# Patient Record
Sex: Female | Born: 1935 | Hispanic: No | Marital: Married | Smoking: Never smoker
Health system: Southern US, Community
[De-identification: ages and names within clinical notes are randomized; demographics above are authoritative.]

## PROBLEM LIST (undated history)

## (undated) DIAGNOSIS — C801 Malignant (primary) neoplasm, unspecified: Secondary | ICD-10-CM

## (undated) HISTORY — PX: CHOLECYSTECTOMY: SHX55

---

## 2003-01-10 ENCOUNTER — Encounter: Payer: Self-pay | Admitting: Internal Medicine

## 2003-01-10 ENCOUNTER — Ambulatory Visit (HOSPITAL_COMMUNITY): Admission: RE | Admit: 2003-01-10 | Discharge: 2003-01-10 | Payer: Self-pay | Admitting: Internal Medicine

## 2003-02-22 ENCOUNTER — Ambulatory Visit (HOSPITAL_COMMUNITY): Admission: RE | Admit: 2003-02-22 | Discharge: 2003-02-22 | Payer: Self-pay | Admitting: Family Medicine

## 2003-03-03 ENCOUNTER — Ambulatory Visit (HOSPITAL_COMMUNITY): Admission: RE | Admit: 2003-03-03 | Discharge: 2003-03-03 | Payer: Self-pay | Admitting: Internal Medicine

## 2003-03-03 ENCOUNTER — Encounter: Payer: Self-pay | Admitting: Internal Medicine

## 2003-04-02 ENCOUNTER — Other Ambulatory Visit: Admission: RE | Admit: 2003-04-02 | Discharge: 2003-04-02 | Payer: Self-pay | Admitting: Family Medicine

## 2003-05-06 ENCOUNTER — Encounter: Payer: Self-pay | Admitting: Internal Medicine

## 2003-05-06 ENCOUNTER — Ambulatory Visit (HOSPITAL_COMMUNITY): Admission: RE | Admit: 2003-05-06 | Discharge: 2003-05-06 | Payer: Self-pay | Admitting: Internal Medicine

## 2003-06-04 ENCOUNTER — Ambulatory Visit (HOSPITAL_COMMUNITY): Admission: RE | Admit: 2003-06-04 | Discharge: 2003-06-04 | Payer: Self-pay | Admitting: Internal Medicine

## 2003-06-04 ENCOUNTER — Encounter: Payer: Self-pay | Admitting: Internal Medicine

## 2004-02-25 ENCOUNTER — Ambulatory Visit (HOSPITAL_COMMUNITY): Admission: RE | Admit: 2004-02-25 | Discharge: 2004-02-25 | Payer: Self-pay | Admitting: Family Medicine

## 2004-12-02 ENCOUNTER — Ambulatory Visit: Payer: Self-pay | Admitting: Nurse Practitioner

## 2004-12-17 ENCOUNTER — Ambulatory Visit: Payer: Self-pay | Admitting: Nurse Practitioner

## 2004-12-30 ENCOUNTER — Ambulatory Visit: Payer: Self-pay | Admitting: Nurse Practitioner

## 2005-03-04 ENCOUNTER — Ambulatory Visit (HOSPITAL_COMMUNITY): Admission: RE | Admit: 2005-03-04 | Discharge: 2005-03-04 | Payer: Self-pay | Admitting: Family Medicine

## 2005-03-25 ENCOUNTER — Ambulatory Visit: Payer: Self-pay | Admitting: Nurse Practitioner

## 2005-03-26 ENCOUNTER — Ambulatory Visit (HOSPITAL_COMMUNITY): Admission: RE | Admit: 2005-03-26 | Discharge: 2005-03-26 | Payer: Self-pay | Admitting: Internal Medicine

## 2005-04-26 ENCOUNTER — Ambulatory Visit: Payer: Self-pay | Admitting: Nurse Practitioner

## 2005-12-22 ENCOUNTER — Emergency Department (HOSPITAL_COMMUNITY): Admission: EM | Admit: 2005-12-22 | Discharge: 2005-12-22 | Payer: Self-pay | Admitting: *Deleted

## 2006-01-12 ENCOUNTER — Ambulatory Visit: Payer: Self-pay | Admitting: Nurse Practitioner

## 2006-03-09 ENCOUNTER — Ambulatory Visit (HOSPITAL_COMMUNITY): Admission: RE | Admit: 2006-03-09 | Discharge: 2006-03-09 | Payer: Self-pay | Admitting: Internal Medicine

## 2006-08-31 ENCOUNTER — Inpatient Hospital Stay (HOSPITAL_COMMUNITY): Admission: EM | Admit: 2006-08-31 | Discharge: 2006-09-07 | Payer: Self-pay | Admitting: Emergency Medicine

## 2006-09-01 ENCOUNTER — Encounter (INDEPENDENT_AMBULATORY_CARE_PROVIDER_SITE_OTHER): Payer: Self-pay | Admitting: Specialist

## 2006-12-01 ENCOUNTER — Ambulatory Visit (HOSPITAL_COMMUNITY): Admission: RE | Admit: 2006-12-01 | Discharge: 2006-12-01 | Payer: Self-pay | Admitting: Gastroenterology

## 2007-03-15 ENCOUNTER — Ambulatory Visit (HOSPITAL_COMMUNITY): Admission: RE | Admit: 2007-03-15 | Discharge: 2007-03-15 | Payer: Self-pay | Admitting: Nurse Practitioner

## 2007-10-18 ENCOUNTER — Encounter (INDEPENDENT_AMBULATORY_CARE_PROVIDER_SITE_OTHER): Payer: Self-pay | Admitting: Nurse Practitioner

## 2007-10-18 ENCOUNTER — Ambulatory Visit: Payer: Self-pay | Admitting: Family Medicine

## 2007-10-18 LAB — CONVERTED CEMR LAB
ALT: 12 units/L (ref 0–35)
BUN: 12 mg/dL (ref 6–23)
Basophils Absolute: 0 10*3/uL (ref 0.0–0.1)
CO2: 24 meq/L (ref 19–32)
Cholesterol: 200 mg/dL (ref 0–200)
Creatinine, Ser: 0.71 mg/dL (ref 0.40–1.20)
Eosinophils Relative: 2 % (ref 0–5)
Glucose, Bld: 80 mg/dL (ref 70–99)
HCT: 42.6 % (ref 36.0–46.0)
HDL: 52 mg/dL (ref >39)
Hemoglobin: 13.8 g/dL (ref 12.0–15.0)
Lymphocytes Relative: 23 % (ref 12–46)
Monocytes Absolute: 0.3 10*3/uL (ref 0.1–1.0)
RDW: 12.9 % (ref 11.5–15.5)
Total Bilirubin: 0.6 mg/dL (ref 0.3–1.2)
Total CHOL/HDL Ratio: 3.8
Triglycerides: 109 mg/dL (ref <150)
VLDL: 22 mg/dL (ref 0–40)

## 2008-02-27 ENCOUNTER — Ambulatory Visit: Payer: Self-pay | Admitting: Internal Medicine

## 2008-03-20 ENCOUNTER — Ambulatory Visit (HOSPITAL_COMMUNITY): Admission: RE | Admit: 2008-03-20 | Discharge: 2008-03-20 | Payer: Self-pay | Admitting: Family Medicine

## 2008-10-15 ENCOUNTER — Ambulatory Visit: Payer: Self-pay | Admitting: Family Medicine

## 2008-10-15 ENCOUNTER — Encounter (INDEPENDENT_AMBULATORY_CARE_PROVIDER_SITE_OTHER): Payer: Self-pay | Admitting: Internal Medicine

## 2008-10-15 LAB — CONVERTED CEMR LAB
Alkaline Phosphatase: 77 units/L (ref 39–117)
BUN: 11 mg/dL (ref 6–23)
Cholesterol: 190 mg/dL (ref 0–200)
Creatinine, Ser: 0.75 mg/dL (ref 0.40–1.20)
Glucose, Bld: 95 mg/dL (ref 70–99)
HDL: 45 mg/dL (ref >39)
LDL Cholesterol: 121 mg/dL — ABNORMAL HIGH (ref 0–99)
Total Bilirubin: 0.7 mg/dL (ref 0.3–1.2)
Triglycerides: 119 mg/dL (ref <150)
VLDL: 24 mg/dL (ref 0–40)

## 2008-10-29 ENCOUNTER — Ambulatory Visit: Payer: Self-pay | Admitting: Internal Medicine

## 2008-11-20 ENCOUNTER — Ambulatory Visit: Payer: Self-pay | Admitting: Internal Medicine

## 2009-02-05 ENCOUNTER — Ambulatory Visit: Payer: Self-pay | Admitting: Internal Medicine

## 2009-04-21 ENCOUNTER — Ambulatory Visit (HOSPITAL_COMMUNITY): Admission: RE | Admit: 2009-04-21 | Discharge: 2009-04-21 | Payer: Self-pay | Admitting: *Deleted

## 2009-06-04 ENCOUNTER — Encounter (INDEPENDENT_AMBULATORY_CARE_PROVIDER_SITE_OTHER): Payer: Self-pay | Admitting: Internal Medicine

## 2009-06-04 ENCOUNTER — Ambulatory Visit: Payer: Self-pay | Admitting: Internal Medicine

## 2009-06-18 ENCOUNTER — Ambulatory Visit: Payer: Self-pay | Admitting: Internal Medicine

## 2009-09-15 ENCOUNTER — Ambulatory Visit: Payer: Self-pay | Admitting: Internal Medicine

## 2009-10-23 ENCOUNTER — Emergency Department (HOSPITAL_COMMUNITY): Admission: EM | Admit: 2009-10-23 | Discharge: 2009-10-23 | Payer: Self-pay | Admitting: Emergency Medicine

## 2009-11-11 ENCOUNTER — Ambulatory Visit (HOSPITAL_COMMUNITY): Admission: RE | Admit: 2009-11-11 | Discharge: 2009-11-11 | Payer: Self-pay | Admitting: Plastic Surgery

## 2009-12-09 ENCOUNTER — Ambulatory Visit (HOSPITAL_COMMUNITY): Admission: RE | Admit: 2009-12-09 | Discharge: 2009-12-09 | Payer: Self-pay | Admitting: Plastic Surgery

## 2009-12-15 ENCOUNTER — Ambulatory Visit: Payer: Self-pay | Admitting: Internal Medicine

## 2010-04-24 ENCOUNTER — Ambulatory Visit (HOSPITAL_COMMUNITY): Admission: RE | Admit: 2010-04-24 | Discharge: 2010-04-24 | Payer: Self-pay | Admitting: Family Medicine

## 2011-03-26 NOTE — Consult Note (Signed)
NAMEHELENE, Christina [name] NO.:  [id number]   MEDICAL RECORD NO.:  [id number]          PATIENT TYPE:  INP   LOCATION:  5014                         FACILITY:  [facility]   PHYSICIAN:  Graylin Shiver, M.D.   DATE OF BIRTH:  1931-10-04   DATE OF CONSULTATION:  09/04/2006  DATE OF DISCHARGE:                                   CONSULTATION   REASON FOR CONSULTATION:  The patient is a 77 year old Caucasian female  status post laparoscopic cholecystectomy on September 01, 2006 for  cholecystitis and gallstones.  Her intraoperative cholangiogram was  negative.  No common duct stones were seen.  No leaking was seen.  Three  clips were placed on the cystic duct.  A drain was left in place because of  a raw surface around the gallbladder bed per operative report.  The patient  reports continued drainage in her drain and the bulb having to be changed  more often.  She is still experiencing some pain and the right upper  quadrant.   ALLERGIES:  CYANOCOBALAMIN.   MEDICATIONS PRIOR TO ADMISSION:  None.   PAST MEDICAL HISTORY:  None stated.   PAST SURGICAL HISTORY:  1. Appendectomy.  2. Hemorrhoidectomy.  3. Cataract surgery.  4. Cholecystectomy.   SOCIAL HISTORY:  Does not smoke or drink alcohol.   REVIEW OF SYSTEMS:  No chest pain, shortness of breath, cough or sputum  production.   PHYSICAL EXAMINATION:  VITAL SIGNS:  Stable.  She is afebrile.  HEENT:  Nonicteric.  HEART:  Regular rhythm.  No murmurs.  LUNGS:  Clear.  ABDOMEN:  Bowel sounds normal, soft.  Minimal tenderness in the right upper  quadrant compatible with postoperative state.   LABORATORY DATA:  WBC 6200, bilirubin 1.4, alkaline phosphatase 118, AST 35,  ALT 87.   IMPRESSION:  1. Status post laparoscopic cholecystectomy secondary to cholecystitis and      cholelithiasis.  2. Question of a bile leak versus continued drainage from raw surface of      the gallbladder bed.   PLAN:  Initially, we will proceed  with a HIDA scan to see if there is any  evidence of a bile leak.  If there is evidence of a bile leak, then we will  set her up for ERCP with stent placement.           ______________________________  Graylin Shiver, M.D.     SFG/MEDQ  D:  09/04/2006  T:  09/05/2006  Job:  [id number]   cc:   Ollen Gross. Vernell Morgans, M.D.

## 2011-03-26 NOTE — Discharge Summary (Signed)
[name], [name]                ACCOUNT NO.:  [id number]   MEDICAL RECORD NO.:  [id number]          PATIENT TYPE:  INP   LOCATION:  5014                         FACILITY:  [facility]   PHYSICIAN:  Maisie Fus A. Cornett, M.D.DATE OF BIRTH:  1931/08/21   DATE OF ADMISSION:  08/31/2006  DATE OF DISCHARGE:  09/07/2006                                 DISCHARGE SUMMARY   ADMISSION DIAGNOSES:  1. Acute cholecystitis.  2. Bile leak status post laparoscopic cholecystectomy.   DISCHARGE DIAGNOSES:  Same.   PROCEDURE PERFORMED:  1. Laparoscopic cholecystectomy, cholangiogram by Dr. Luretha Murphy.  2. ERCP, Dr. Vida Rigger.   BRIEF HISTORY:  The patient is a 77 year old female who came in with  cholecystitis on August 31, 2006.  She was admitted for laparoscopic  cholecystectomy, which was done on September 01, 2006.  Please see operative  note for details.   The patient was admitted post-op on September 01, 2006 after a laparoscopic  cholecystectomy.  She had a drain in place which showed bilious drainage,  which increased in quantity.  Her LFTs were relatively stable, but she  continued to have some discomfort in her upper abdomen and did not feel  well.  Gastroenterology was consulted on September 03, 2006 because of the  increasing bilious drainage and increased abdominal pain.  Hepatic biliary  scan was obtained on September 05, 2006, which showed extravasation of bile,  and ERCP with stent was performed.  After that, her bile drainage decreased  considerably.  Her pain resolved, and her fever went away.  Her white count  was normal.  She tolerating a diet, had a benign abdominal examination and  had some minimal serosanguineous bilious tinted fluid at discharge.  It was  felt she was safe for discharge this point.  She was on Cipro will complete  a 7-day course of that as an outpatient.   DISCHARGE INSTRUCTIONS:  She will follow up next week with Dr. Luretha Murphy in the clinic to have her  drain removed.  The office will call to  contact her for that appointment.  We will show her how to take care of her  drain and empty it every day.  She will continue Cipro 500 mg p.o. b.i.d.  and Vicodin 1-2 tabs p.o. q.4 p.r.n. pain.  She will shower and refrain from  driving for 1 week and lifting for 2 weeks.  Her diet will be a regular diet  as tolerated.   CONDITION ON DISCHARGE:  Improved.      Thomas A. Cornett, M.D.  Electronically Signed    TAC/MEDQ  D:  09/07/2006  T:  09/07/2006  Job:  [id number]

## 2011-03-26 NOTE — Op Note (Signed)
NAMEMARGENE, CHERIAN                ACCOUNT NO.:  [id number]   MEDICAL RECORD NO.:  [id number]          PATIENT TYPE:  AMB   LOCATION:  ENDO                         FACILITY:  [facility]   PHYSICIAN:  Petra Kuba, M.D.    DATE OF BIRTH:  12/15/1930   DATE OF PROCEDURE:  12/01/2006  DATE OF DISCHARGE:                               OPERATIVE REPORT   PROCEDURE:  EGD with stent removal.   INDICATIONS:  Patient with stent placed due to bile leak, want to remove  since doing well postoperatively.  Consent was signed after risks,  benefits, methods and options thoroughly discussed in the office with  both the patient and her family.   MEDICINES USED:  Fentanyl 75 mcg, Versed 6 mg.   PROCEDURE:  The video endoscope was inserted by direct vision.  Esophagus was normal.  Quick look at the stomach on straight  visualization did not reveal any obvious problems.  We did advance  through a normal antrum, normal pylorus, into a normal duodenal bulb and  around the C-loop where the stent was found in the proper position and  was easily snared and withdrawn.  Both were removed in tandem.  The  procedure was terminated at this junction.  Once the stent was  recovered, the patient tolerated the procedure well.  There was no  obvious immediate complication.   ENDOSCOPIC DIAGNOSIS:  Normal quick esophagogastroduodenoscopy, status  post snare and removal of the biliary stent.   PLAN:  Happy to see back p.r.n.  See how she does.  Otherwise, return  care to Dr. Emeline Darling for the customary healthcare screening and maintenance.           ______________________________  Petra Kuba, M.D.     MEM/MEDQ  D:  12/01/2006  T:  12/01/2006  Job:  [id number]   cc:   Thornton Park Daphine Deutscher, MD  Duke Salvia, M.D.

## 2011-03-26 NOTE — Op Note (Signed)
Christina [name], Christina [name]                ACCOUNT NO.:  [id number]   MEDICAL RECORD NO.:  [id number]          PATIENT TYPE:  INP   LOCATION:  5014                         FACILITY:  [facility]   PHYSICIAN:  Petra Kuba, M.D.    DATE OF BIRTH:  [birth year]   DATE OF PROCEDURE:  09/05/2006  DATE OF DISCHARGE:                                 OPERATIVE REPORT   PROCEDURE:  Endoscopic retrograde cholangiopancreatography sphincterotomy  and stent placement.   INDICATIONS:  Bile leak.  Consent was signed after risks, benefits, methods,  options thoroughly discussed by Dr. Randa Evens prior to any sedation and me  with the patient prior to any sedation.   MEDICINES USED:  Fentanyl 125 mcg, Versed 12.5 mg.   PROCEDURE:  The side-viewing therapeutic video duodenoscope was inserted by  indirect vision into the stomach and advanced through a normal pylorus into  the duodenum and a fairly normal slightly bulbous ampulla was brought into  view.  We did start the procedure with her on her left side.  We went ahead  and tried to cannulate with the triple-lumen sphincterotome loaded with the  Jag wire.  Unfortunately in advancing the wire we seemed to go towards the  pancreas.  We did try to inject dye a few times but it back washed into the  duodenum so we elected after a few attempts to roll her on her belly and  then we were able to get deep selective cannulation again cannulating with  the wire in the triple-lumen sphincterotome.  The wire did go a few times  towards the pancreas but in reconfiguring the sphincterotome we were able to  get deep selective cannulation.  Once that was done, the CBD was filled  which was normal size, normal intrahepatic, cystic duct remnant was normal  although off the intrahepatics seemingly instead of the main CBD but without  obvious leak.  We went ahead and proceeded with a small to medium-sized  sphincterotomy until adequate biliary drainage was seen and we were able to  get the half bowed sphincterotome in and out of the duct.  We then exchanged  the sphincterotome with a 10-French 7 cm stent since no stones were seen on  intraoperative cholangiogram and on cholangiogram today.  The 10-French 7 cm  stent was placed in the usual fashion.  The introducer and wire were  removed.  There was adequate biliary drainage, we elected to stop the  procedure at this junction.  The scope was removed.  The patient tolerated  the procedure well.  There was no obvious immediate complication.   ENDOSCOPIC DIAGNOSES:  1. Bulbous ampulla.  2. No pancreatic duct injections, although the wire with seemingly      advanced in that direction a few times.  3. Normal common bile duct and bifurcation cystic duct remnant without      obvious leak. Though the cystic duct seemed to be coming off with the      intrahepatics.  4. Medium sphincterotomy done.  5. 10-French 7 cm stent placed with adequate biliary drainage.   PLAN:  Will observe for delayed complications.  See how the stent works,  assuming no delayed complications and the patient improved and slowly  advance diet and hopefully be able to go home and follow-up with me in the  office in 4 to 6 weeks to set up presumed stent removal.  Happy to see back  sooner p.r.n.           ______________________________  Petra Kuba, M.D.     MEM/MEDQ  D:  09/05/2006  T:  09/06/2006  Job:  [id number]   cc:   Thornton Park Daphine Deutscher, MD

## 2011-03-26 NOTE — Op Note (Signed)
Christina [name], Christina [name]                ACCOUNT NO.:  [id number]   MEDICAL RECORD NO.:  [id number]          PATIENT TYPE:  INP   LOCATION:  5014                         FACILITY:  [facility]   PHYSICIAN:  Thornton Park. Daphine Deutscher, MD  DATE OF BIRTH:  1931/10/23   DATE OF PROCEDURE:  09/01/2006  DATE OF DISCHARGE:                                 OPERATIVE REPORT   PREOPERATIVE DIAGNOSIS:  Cholecystitis.   POSTOPERATIVE DIAGNOSIS:  Acute and subacute cholecystitis with normal  intraoperative cholangiogram.   PROCEDURE:  Laparoscopic cholecystectomy with intraoperative cholangiogram.   DRAIN:  One 19 Blake drain.   SURGEON:  Thornton Park. Daphine Deutscher, MD.   ASSISTANT:  Velora Heckler, MD.   ANESTHESIA:  General.   DESCRIPTION OF PROCEDURE:  Ms. Douse was taken to OR #17 and given general  anesthesia.  The abdomen was prepped with Betadine and draped sterilely.  Access was gained through the umbilicus with a longitudinal incision and a  Hasson cannula.  Three trocars were placed in the upper abdomen.  Her  gallbladder was completely walled off and appeared to be chronically  inflamed.  She also had a lot of adhesions above her liver possibly from her  previous appendectomy back in 1949.  She could have had a ruptured appendix  with an abscess.  We went ahead and stripped this away and did get into some  bleeding, I had to put some clips up on the fat.  Once again, I freed this  area up.  I then decompressed gallbladder and got out a lot of bowel and  then elevated the gallbladder and dissected out the cystic duct.  I put a  Reddick catheter in after clipping the proximal cystic duct and the arteries  and did dynamic cholangiogram which showed free filling of the common bile  duct with the intrahepatic ducts filled and free flow into the duodenum.  The cystic duct was then triple clipped and divided.  Cystic artery was  divided after clipping and the gallbladder was removed from the gallbladder  bed.  It  was placed in a bag and brought out through the umbilicus.  It  contained a lot of black anthracotic pigment stones.  The gallbladder bed  was then cauterized.  No bleeding or bile leaks were definitely seen it was  just a very raw surface so I went ahead and put a drain in.   The umbilical defect was repaired under laparoscopic vision with 0 Vicryl  and then I closed the skin with 4-0 Vicryl.  Benzoin and Steri-Strips  applied.  The patient tolerated the procedure well and was taken to the  recovery room in satisfactory condition.      Thornton Park Daphine Deutscher, MD  Electronically Signed    MBM/MEDQ  D:  09/01/2006  T:  09/02/2006  Job:  [id number]

## 2011-04-22 ENCOUNTER — Other Ambulatory Visit (HOSPITAL_COMMUNITY): Payer: Self-pay | Admitting: Family Medicine

## 2011-04-22 DIAGNOSIS — Z1231 Encounter for screening mammogram for malignant neoplasm of breast: Secondary | ICD-10-CM

## 2011-05-05 ENCOUNTER — Ambulatory Visit (HOSPITAL_COMMUNITY)
Admission: RE | Admit: 2011-05-05 | Discharge: 2011-05-05 | Disposition: A | Discharge status: Home / Self Care | Payer: Medicare Other | Source: Ambulatory Visit | Attending: Family Medicine | Admitting: Family Medicine

## 2011-05-05 DIAGNOSIS — Z1231 Encounter for screening mammogram for malignant neoplasm of breast: Secondary | ICD-10-CM | POA: Insufficient documentation

## 2017-03-17 ENCOUNTER — Ambulatory Visit (INDEPENDENT_AMBULATORY_CARE_PROVIDER_SITE_OTHER): Payer: Medicare Other

## 2017-03-17 ENCOUNTER — Ambulatory Visit (INDEPENDENT_AMBULATORY_CARE_PROVIDER_SITE_OTHER): Payer: Medicare Other | Admitting: Orthopaedic Surgery

## 2017-03-17 DIAGNOSIS — M25551 Pain in right hip: Secondary | ICD-10-CM | POA: Diagnosis not present

## 2017-03-17 DIAGNOSIS — M25562 Pain in left knee: Secondary | ICD-10-CM

## 2017-03-17 DIAGNOSIS — M7061 Trochanteric bursitis, right hip: Secondary | ICD-10-CM | POA: Diagnosis not present

## 2017-03-17 DIAGNOSIS — G8929 Other chronic pain: Secondary | ICD-10-CM

## 2017-03-17 DIAGNOSIS — M25561 Pain in right knee: Secondary | ICD-10-CM | POA: Diagnosis not present

## 2017-03-17 MED ORDER — LIDOCAINE HCL 1 % IJ SOLN
3.0000 mL | INTRAMUSCULAR | Status: AC | PRN
  Administered 2017-03-17: 3 mL

## 2017-03-17 MED ORDER — METHYLPREDNISOLONE ACETATE 40 MG/ML IJ SUSP
40.0000 mg | INTRAMUSCULAR | Status: AC | PRN
  Administered 2017-03-17: 40 mg via INTRA_ARTICULAR

## 2017-03-17 NOTE — Progress Notes (Signed)
ZA

## 2017-03-17 NOTE — Progress Notes (Signed)
Office Visit Note   Patient: Christina [name]           Date of Birth: August 02, 1931           MRN: [id number] Visit Date: 03/17/2017              Requested by: Nolene Ebbs, MD [address] [place], [place] [address] PCP: Nolene Ebbs, MD   Assessment & Plan: Visit Diagnoses:  1. Chronic pain of left knee   2. Chronic pain of right knee   3. Pain in right hip   4. Trochanteric bursitis, right hip     Plan: She would definitely benefit from quad strengthening exercises and I showed her these and have them demonstrated back to me. Also should benefit from trochanteric stretching exercises as well and she was able to demonstrate these back to me. Certainly all 3 areas could benefit from steroid injections. She tolerated these well after expanded the risk and benefits of injections. The other thing I could recommend for her would be Aleve or Motrin 2-3 times a day as needed. Also physical therapy for conditioning and strengthening if needed. Her daughter said if she needs that she will let us know. She'll otherwise follow up as needed. All questions were encouraged and answered.  Follow-Up Instructions: Return if symptoms worsen or fail to improve.   Orders:  Orders Placed This Encounter  Procedures  . Large Joint Injection/Arthrocentesis  . Large Joint Injection/Arthrocentesis  . Large Joint Injection/Arthrocentesis  . XR HIP UNILAT W OR W/O PELVIS 1V RIGHT  . XR Knee 1-2 Views Left  . XR Knee 1-2 Views Right   No orders of the defined types were placed in this encounter.     Procedures: Large Joint Inj Date/Time: 03/17/2017 5:05 PM Performed by: Mcarthur Rossetti Authorized by: Jean Rosenthal Y   Location:  Knee Site:  R knee Ultrasound Guidance: No   Fluoroscopic Guidance: No   Arthrogram: No   Medications:  3 mL lidocaine 1 %; 40 mg methylPREDNISolone acetate 40 MG/ML Large Joint Inj Date/Time: 03/17/2017 5:05 PM Performed by: Mcarthur Rossetti Authorized by: Mcarthur Rossetti   Location:  Knee Site:  L knee Ultrasound Guidance: No   Fluoroscopic Guidance: No   Arthrogram: No   Medications:  3 mL lidocaine 1 %; 40 mg methylPREDNISolone acetate 40 MG/ML Large Joint Inj Date/Time: 03/17/2017 5:05 PM Performed by: Mcarthur Rossetti Authorized by: Mcarthur Rossetti   Location:  Hip Site:  R greater trochanter Ultrasound Guidance: No   Fluoroscopic Guidance: No   Arthrogram: No   Medications:  3 mL lidocaine 1 %; 40 mg methylPREDNISolone acetate 40 MG/ML     Clinical Data: No additional findings.   Subjective: No chief complaint on file. The patient is a very pleasant 83 year old female originally from [place] who comes with her daughter with chief complaint of bilateral knee pain and right hip pain. Slowly worsening for years. She is someone who walks with a cane in her right hand. She does not do a lot of exercising otherwise though she is a healthy individual who doesn't really take any significant medications either. She denies locking catching in either knee. She does have slight groin pain on the right hip. Her biggest problems going up and downstairs. She denies any injuries. It has detrimentally affected her activities daily living, her quality of life and her mobility.  HPI  Review of Systems She currently denies any headache, chest pain, short of  breath, fever, chills, nausea, vomiting.  Objective: Vital Signs: There were no vitals taken for this visit.  Physical Exam She is alert and oriented 3 mobilizes slowly. She is in no acute distress. She follows commands appropriately. Ortho Exam Examination of her right hip shows fluid range of motion with no pain in the groin on my exam today. Her pain is only over the trochanteric area and IT band. Examination of both knee show slight valgus malalignment and some patellofemoral crepitation but no effusion. Both knees are ligamentously stable.  Both knees have good range of motion. Specialty Comments:  No specialty comments available.  Imaging: Xr Hip Unilat W Or W/o Pelvis 1v Right  Result Date: 03/17/2017 An AP pelvis lateral of her right hip show well maintained hip joint space. There are no acute changes in either hip or trochanteric areas. There is no significant arthritic changes.  Xr Knee 1-2 Views Left  Result Date: 03/17/2017 An AP and lateral of the left knee show no acute findings. There is only mild arthritic changes.  Xr Knee 1-2 Views Right  Result Date: 03/17/2017 An AP and lateral of the right knee shows no acute findings. There is only mild arthritic changes.    PMFS History: Patient Active Problem List   Diagnosis Date Noted  . Chronic pain of left knee 03/17/2017  . Chronic pain of right knee 03/17/2017   No past medical history on file.  No family history on file.  No past surgical history on file. Social History   Occupational History  . Not on file.   Social History Main Topics  . Smoking status: Not on file  . Smokeless tobacco: Not on file  . Alcohol use Not on file  . Drug use: Unknown  . Sexual activity: Not on file

## 2018-03-09 ENCOUNTER — Encounter (INDEPENDENT_AMBULATORY_CARE_PROVIDER_SITE_OTHER): Payer: Self-pay | Admitting: Orthopaedic Surgery

## 2018-03-09 ENCOUNTER — Ambulatory Visit (INDEPENDENT_AMBULATORY_CARE_PROVIDER_SITE_OTHER): Payer: Medicare Other | Admitting: Orthopaedic Surgery

## 2018-03-09 DIAGNOSIS — M25562 Pain in left knee: Secondary | ICD-10-CM | POA: Diagnosis not present

## 2018-03-09 DIAGNOSIS — M25561 Pain in right knee: Secondary | ICD-10-CM

## 2018-03-09 DIAGNOSIS — G8929 Other chronic pain: Secondary | ICD-10-CM | POA: Diagnosis not present

## 2018-03-09 MED ORDER — LIDOCAINE HCL 1 % IJ SOLN
3.0000 mL | INTRAMUSCULAR | Status: AC | PRN
  Administered 2018-03-09: 3 mL

## 2018-03-09 MED ORDER — METHYLPREDNISOLONE ACETATE 40 MG/ML IJ SUSP
40.0000 mg | INTRAMUSCULAR | Status: AC | PRN
  Administered 2018-03-09: 40 mg via INTRA_ARTICULAR

## 2018-03-09 NOTE — Progress Notes (Signed)
Office Visit Note   Patient: Christina [name]           Date of Birth: 1930/12/25           MRN: [id number] Visit Date: 03/09/2018              Requested by: Nolene Ebbs, MD [address] [place], [place] [address] PCP: Nolene Ebbs, MD   Assessment & Plan: Visit Diagnoses:  1. Chronic pain of left knee   2. Chronic pain of right knee     Plan: I agree with trying steroid injections in both her knees given her age and health status.  She tolerated them well.  She understands the risk and benefits of these injections as well.  Follow-up will be as needed.  The family knows to wait at least 3 to 4 months between steroid injections.  All questions concerns were answered and addressed.  Follow-Up Instructions: Return if symptoms worsen or fail to improve.   Orders:  Orders Placed This Encounter  Procedures  . Large Joint Inj  . Large Joint Inj   No orders of the defined types were placed in this encounter.     Procedures: Large Joint Inj: R knee on 03/09/2018 3:06 PM Indications: diagnostic evaluation and pain Details: 22 G 1.5 in needle, superolateral approach  Arthrogram: No  Medications: 3 mL lidocaine 1 %; 40 mg methylPREDNISolone acetate 40 MG/ML Outcome: tolerated well, no immediate complications Procedure, treatment alternatives, risks and benefits explained, specific risks discussed. Consent was given by the patient. Immediately prior to procedure a time out was called to verify the correct patient, procedure, equipment, support staff and site/side marked as required. Patient was prepped and draped in the usual sterile fashion.   Large Joint Inj: L knee on 03/09/2018 3:07 PM Indications: diagnostic evaluation and pain Details: 22 G 1.5 in needle, superolateral approach  Arthrogram: No  Medications: 3 mL lidocaine 1 %; 40 mg methylPREDNISolone acetate 40 MG/ML Outcome: tolerated well, no immediate complications Procedure, treatment alternatives, risks and benefits  explained, specific risks discussed. Consent was given by the patient. Immediately prior to procedure a time out was called to verify the correct patient, procedure, equipment, support staff and site/side marked as required. Patient was prepped and draped in the usual sterile fashion.       Clinical Data: No additional findings.   Subjective: Chief Complaint  Patient presents with  . Left Knee - Pain  . Right Knee - Pain  The patient comes in today for injections in both her knees.  She ambulates with a cane.  She is 84 years old and originally from [place].  She does deal with bad arthritis pain in both her knees and steroids have worked for her in the past.  She would like to have these in both knees today.  She is not a diabetic.  Her son is with her to interpret.  HPI  Review of Systems She currently denies any headache, chest pain, shortness of breath, fever, chills, nausea, vomiting.  Objective: Vital Signs: There were no vitals taken for this visit.  Physical Exam She is alert and oriented x3 and in no acute distress  Ortho Exam examination of both knees show pain throughout her arc of motion in both knees.  Both lower extremities have edema.  The knees are otherwise ligamentously stable. Specialty Comments:  No specialty comments available.  Imaging: No results found.   PMFS History: Patient Active Problem List   Diagnosis Date Noted  .  Chronic pain of left knee 03/17/2017  . Chronic pain of right knee 03/17/2017   History reviewed. No pertinent past medical history.  History reviewed. No pertinent family history.  History reviewed. No pertinent surgical history. Social History   Occupational History  . Not on file  Tobacco Use  . Smoking status: Not on file  Substance and Sexual Activity  . Alcohol use: Not on file  . Drug use: Not on file  . Sexual activity: Not on file

## 2018-05-31 ENCOUNTER — Telehealth (INDEPENDENT_AMBULATORY_CARE_PROVIDER_SITE_OTHER): Payer: Self-pay | Admitting: Orthopaedic Surgery

## 2018-05-31 NOTE — Telephone Encounter (Signed)
Cleone Slim from [facility] called left voicemail message needing office notes faxed to her for DOS 03/09/18. The fax# is [phone]   Cleone Slim did not leave a phone number.

## 2018-06-01 NOTE — Telephone Encounter (Signed)
Faxed to provided number  

## 2018-07-26 ENCOUNTER — Ambulatory Visit (INDEPENDENT_AMBULATORY_CARE_PROVIDER_SITE_OTHER): Payer: Medicare Other | Admitting: Orthopaedic Surgery

## 2018-07-26 ENCOUNTER — Ambulatory Visit (INDEPENDENT_AMBULATORY_CARE_PROVIDER_SITE_OTHER): Payer: Medicare Other

## 2018-07-26 ENCOUNTER — Encounter (INDEPENDENT_AMBULATORY_CARE_PROVIDER_SITE_OTHER): Payer: Self-pay | Admitting: Orthopaedic Surgery

## 2018-07-26 DIAGNOSIS — M25561 Pain in right knee: Secondary | ICD-10-CM | POA: Diagnosis not present

## 2018-07-26 DIAGNOSIS — M25562 Pain in left knee: Secondary | ICD-10-CM | POA: Diagnosis not present

## 2018-07-26 DIAGNOSIS — M25511 Pain in right shoulder: Secondary | ICD-10-CM

## 2018-07-26 DIAGNOSIS — M1712 Unilateral primary osteoarthritis, left knee: Secondary | ICD-10-CM | POA: Diagnosis not present

## 2018-07-26 DIAGNOSIS — M1711 Unilateral primary osteoarthritis, right knee: Secondary | ICD-10-CM | POA: Diagnosis not present

## 2018-07-26 MED ORDER — LIDOCAINE HCL 1 % IJ SOLN
1.0000 mL | INTRAMUSCULAR | Status: AC | PRN
  Administered 2018-07-26: 1 mL

## 2018-07-26 MED ORDER — METHYLPREDNISOLONE ACETATE 40 MG/ML IJ SUSP
40.0000 mg | INTRAMUSCULAR | Status: AC | PRN
  Administered 2018-07-26: 40 mg via INTRA_ARTICULAR

## 2018-07-26 MED ORDER — LIDOCAINE HCL 1 % IJ SOLN
3.0000 mL | INTRAMUSCULAR | Status: AC | PRN
  Administered 2018-07-26: 3 mL

## 2018-07-26 NOTE — Progress Notes (Signed)
Office Visit Note   Patient: Christina [name]           Date of Birth: [birth year]           MRN: [id number] Visit Date: 07/26/2018              Requested by: Nolene Ebbs, MD [address] [place], [place] [address] PCP: Nolene Ebbs, MD   Assessment & Plan: Visit Diagnoses:  1. Right shoulder pain, unspecified chronicity   2. Left knee pain, unspecified chronicity   3. Right knee pain, unspecified chronicity   4. Unilateral primary osteoarthritis, right knee   5. Unilateral primary osteoarthritis, left knee     Plan: Since she is not a diabetic I did not mind providing steroid injections per her request in both knees and her right shoulder.  This is the best treatment options for someone of her medical status.  Questions were answered and addressed.  She tolerated all 3 injections well.  Follow-up will be as needed.  Follow-Up Instructions: Return if symptoms worsen or fail to improve.   Orders:  Orders Placed This Encounter  Procedures  . Large Joint Inj  . Large Joint Inj  . Large Joint Inj  . XR Knee 1-2 Views Left  . XR Knee 1-2 Views Right  . XR Shoulder Right   No orders of the defined types were placed in this encounter.     Procedures: Large Joint Inj: R subacromial bursa on 07/26/2018 3:37 PM Indications: pain and diagnostic evaluation Details: 22 G 1.5 in needle  Arthrogram: No  Medications: 3 mL lidocaine 1 %; 40 mg methylPREDNISolone acetate 40 MG/ML Outcome: tolerated well, no immediate complications Procedure, treatment alternatives, risks and benefits explained, specific risks discussed. Consent was given by the patient. Immediately prior to procedure a time out was called to verify the correct patient, procedure, equipment, support staff and site/side marked as required. Patient was prepped and draped in the usual sterile fashion.   Large Joint Inj: R knee on 07/26/2018 3:37 PM Indications: diagnostic evaluation and pain Details: 22 G 1.5 in  needle, superolateral approach  Arthrogram: No  Medications: 3 mL lidocaine 1 %; 40 mg methylPREDNISolone acetate 40 MG/ML Outcome: tolerated well, no immediate complications Procedure, treatment alternatives, risks and benefits explained, specific risks discussed. Consent was given by the patient. Immediately prior to procedure a time out was called to verify the correct patient, procedure, equipment, support staff and site/side marked as required. Patient was prepped and draped in the usual sterile fashion.   Large Joint Inj: L knee on 07/26/2018 3:37 PM Indications: diagnostic evaluation and pain Details: 22 G 1.5 in needle, superolateral approach  Arthrogram: No  Medications: 3 mL lidocaine 1 %; 40 mg methylPREDNISolone acetate 40 MG/ML; 1 mL lidocaine 1 % Outcome: tolerated well, no immediate complications Procedure, treatment alternatives, risks and benefits explained, specific risks discussed. Consent was given by the patient. Immediately prior to procedure a time out was called to verify the correct patient, procedure, equipment, support staff and site/side marked as required. Patient was prepped and draped in the usual sterile fashion.       Clinical Data: No additional findings.   Subjective: Chief Complaint  Patient presents with  . Left Knee - Pain  . Right Knee - Pain  The patient is a 84 year old female that I seen before.  She is non-English speaking from [place].  She has had bilateral knee injections back in May of this year that helped her for  a long period of time.  She comes in today requesting injections in both knees.  She also wants evaluation treatment of right shoulder pain.  She does use her cane in the right arm when she walks.  She has pain with reaching overhead and behind her with no known injury.  She denies any neck pain denies any numbness and tingling in her hands.  Family is with her to interpret for her.  She is not a diabetic.  HPI  Review of  Systems She currently denies any headache, chest pain, shortness of breath, fever, chills, nausea, vomiting.  Objective: Vital Signs: There were no vitals taken for this visit.  Physical Exam She is alert and oriented x3 and in no acute distress Ortho Exam Examination of her right shoulder does show good range of motion overall and is well located.  She does have positive signs of impingement with no significant weakness.  Examination of both knees show significant soft tissue envelope around both knees due to obesity.  Both knees have patellofemoral crepitation and medial joint line tenderness with good range of motion. Specialty Comments:  No specialty comments available.  Imaging: Xr Knee 1-2 Views Left  Result Date: 07/26/2018 2 views of the left knee show moderate tricompartmental arthritic changes and otherwise no acute findings.  Xr Knee 1-2 Views Right  Result Date: 07/26/2018 2 views of the right knee show moderate tricompartmental osteoarthritic findings  Xr Shoulder Right  Result Date: 07/26/2018 3 views of the right shoulder show no acute findings.  The shoulder is well located.    PMFS History: Patient Active Problem List   Diagnosis Date Noted  . Chronic pain of left knee 03/17/2017  . Chronic pain of right knee 03/17/2017   History reviewed. No pertinent past medical history.  History reviewed. No pertinent family history.  History reviewed. No pertinent surgical history. Social History   Occupational History  . Not on file  Tobacco Use  . Smoking status: Not on file  Substance and Sexual Activity  . Alcohol use: Not on file  . Drug use: Not on file  . Sexual activity: Not on file

## 2018-07-31 ENCOUNTER — Other Ambulatory Visit: Payer: Self-pay | Admitting: Surgery

## 2018-07-31 DIAGNOSIS — R2231 Localized swelling, mass and lump, right upper limb: Secondary | ICD-10-CM

## 2018-07-31 DIAGNOSIS — N6331 Unspecified lump in axillary tail of the right breast: Secondary | ICD-10-CM

## 2018-08-08 DIAGNOSIS — C801 Malignant (primary) neoplasm, unspecified: Secondary | ICD-10-CM

## 2018-08-08 HISTORY — DX: Malignant (primary) neoplasm, unspecified: C80.1

## 2018-08-14 ENCOUNTER — Ambulatory Visit
Admission: RE | Admit: 2018-08-14 | Discharge: 2018-08-14 | Disposition: A | Discharge status: Home / Self Care | Payer: Medicare Other | Source: Ambulatory Visit | Attending: Surgery | Admitting: Surgery

## 2018-08-14 ENCOUNTER — Other Ambulatory Visit: Payer: Self-pay | Admitting: Surgery

## 2018-08-14 DIAGNOSIS — N6331 Unspecified lump in axillary tail of the right breast: Secondary | ICD-10-CM

## 2018-08-14 DIAGNOSIS — R2231 Localized swelling, mass and lump, right upper limb: Secondary | ICD-10-CM

## 2018-08-18 ENCOUNTER — Other Ambulatory Visit: Payer: Self-pay | Admitting: Surgery

## 2018-08-18 ENCOUNTER — Ambulatory Visit
Admission: RE | Admit: 2018-08-18 | Discharge: 2018-08-18 | Disposition: A | Discharge status: Home / Self Care | Payer: Medicare Other | Source: Ambulatory Visit | Attending: Surgery | Admitting: Surgery

## 2018-08-18 DIAGNOSIS — R2231 Localized swelling, mass and lump, right upper limb: Secondary | ICD-10-CM

## 2018-08-23 ENCOUNTER — Other Ambulatory Visit (HOSPITAL_COMMUNITY): Payer: Self-pay | Admitting: Surgery

## 2018-08-23 ENCOUNTER — Ambulatory Visit (HOSPITAL_COMMUNITY): Admission: RE | Admit: 2018-08-23 | Payer: Medicare Other | Source: Ambulatory Visit

## 2018-08-23 ENCOUNTER — Telehealth: Payer: Self-pay | Admitting: *Deleted

## 2018-08-23 DIAGNOSIS — R591 Generalized enlarged lymph nodes: Secondary | ICD-10-CM

## 2018-08-23 NOTE — Telephone Encounter (Signed)
Received referral from Dr. Vonna Kotyk office for pt to see Dr. Lindi Adie. Appointment date and time given to Dr. Vonna Kotyk nurse. Appt was confirmed with pt.

## 2018-08-24 ENCOUNTER — Other Ambulatory Visit (HOSPITAL_COMMUNITY): Payer: Self-pay | Admitting: Surgery

## 2018-08-24 ENCOUNTER — Ambulatory Visit (HOSPITAL_COMMUNITY): Payer: Medicare Other

## 2018-08-24 ENCOUNTER — Ambulatory Visit (HOSPITAL_COMMUNITY)
Admission: RE | Admit: 2018-08-24 | Discharge: 2018-08-24 | Disposition: A | Discharge status: Home / Self Care | Payer: Medicare Other | Source: Ambulatory Visit | Attending: Surgery | Admitting: Surgery

## 2018-08-24 ENCOUNTER — Encounter (HOSPITAL_COMMUNITY): Payer: Self-pay

## 2018-08-24 DIAGNOSIS — R591 Generalized enlarged lymph nodes: Secondary | ICD-10-CM

## 2018-08-24 DIAGNOSIS — R599 Enlarged lymph nodes, unspecified: Secondary | ICD-10-CM | POA: Diagnosis present

## 2018-08-24 DIAGNOSIS — R590 Localized enlarged lymph nodes: Secondary | ICD-10-CM | POA: Diagnosis not present

## 2018-08-25 ENCOUNTER — Telehealth: Payer: Self-pay | Admitting: Hematology and Oncology

## 2018-08-25 ENCOUNTER — Inpatient Hospital Stay: Payer: Medicare Other | Attending: Hematology and Oncology | Admitting: Hematology and Oncology

## 2018-08-25 DIAGNOSIS — C801 Malignant (primary) neoplasm, unspecified: Secondary | ICD-10-CM

## 2018-08-25 DIAGNOSIS — Z170 Estrogen receptor positive status [ER+]: Secondary | ICD-10-CM | POA: Diagnosis not present

## 2018-08-25 DIAGNOSIS — R1909 Other intra-abdominal and pelvic swelling, mass and lump: Secondary | ICD-10-CM

## 2018-08-25 DIAGNOSIS — Z79811 Long term (current) use of aromatase inhibitors: Secondary | ICD-10-CM | POA: Diagnosis not present

## 2018-08-25 DIAGNOSIS — C773 Secondary and unspecified malignant neoplasm of axilla and upper limb lymph nodes: Secondary | ICD-10-CM | POA: Diagnosis not present

## 2018-08-25 DIAGNOSIS — R591 Generalized enlarged lymph nodes: Secondary | ICD-10-CM

## 2018-08-25 DIAGNOSIS — C562 Malignant neoplasm of left ovary: Secondary | ICD-10-CM | POA: Insufficient documentation

## 2018-08-25 MED ORDER — FUROSEMIDE 40 MG PO TABS: 40.0000 mg | ORAL_TABLET | Freq: Every day | ORAL | Status: DC

## 2018-08-25 MED ORDER — ASPIRIN EC 81 MG PO TBEC: 81.0000 mg | DELAYED_RELEASE_TABLET | Freq: Every day | ORAL | Status: AC

## 2018-08-25 MED ORDER — SPIRONOLACTONE 25 MG PO TABS: 25.0000 mg | ORAL_TABLET | Freq: Every day | ORAL | Status: DC

## 2018-08-25 MED ORDER — DOXEPIN HCL 10 MG PO CAPS: 10.0000 mg | ORAL_CAPSULE | Freq: Every day | ORAL | Status: DC

## 2018-08-25 MED ORDER — FAMOTIDINE 20 MG PO TABS: 20.0000 mg | ORAL_TABLET | Freq: Two times a day (BID) | ORAL | Status: DC

## 2018-08-25 MED ORDER — THERA VITAL M PO TABS: 1.0000 | ORAL_TABLET | Freq: Every day | ORAL | Status: DC

## 2018-08-25 MED ORDER — DICLOFENAC SODIUM 1 % TD GEL: 2.0000 g | Freq: Four times a day (QID) | TRANSDERMAL | Status: AC

## 2018-08-25 MED ORDER — LETROZOLE 2.5 MG PO TABS: 2.5000 mg | ORAL_TABLET | Freq: Every day | ORAL | 3 refills | Status: AC

## 2018-08-25 MED ORDER — GLUCOSAMINE-CHONDROITIN 500-400 MG PO TABS: 1.0000 | ORAL_TABLET | Freq: Every day | ORAL | Status: DC

## 2018-08-25 NOTE — Progress Notes (Signed)
[facility] CONSULT NOTE  Patient Care Team: Nolene Ebbs, MD as PCP - General (Internal Medicine)  CHIEF COMPLAINTS/PURPOSE OF CONSULTATION:  Multiple lymph nodes including axillae and inguinal pelvic para-aortic lymphadenopathy  HISTORY OF PRESENTING ILLNESS:  Christina [name] 84 y.o. female is here because of recent diagnosis of bilateral axillary lymphadenopathy and biopsy of which suggest gynecological primary.  She has had right axillary lymph node for several months initially it was small then it started to increase in size rapidly but then it started to shrink again.  She brought her to the attention of Dr. Nolene Ebbs who then obtained mammograms which did not show any breast cancer.  Axillary lymph nodes were biopsied and she was referred to surgery.  CT scan of the body revealed diffuse abdominal lymphadenopathy and inguinal lymphadenopathy.  She was referred to [place] for discussion regarding treatment options.  She has lost about 20 pounds in the last 6 months.  Her son also is going through multiple health issues and did not accompany her today.  I reviewed her records extensively and collaborated the history with the patient.  SUMMARY OF ONCOLOGIC HISTORY:   Ovarian cancer, left ([facility])   08/18/2018 Initial Diagnosis    Right axillary lymph node biopsy: High-grade carcinoma.  CK7, PAX 8, WT 1, ER and GA TA-3 are positive, suggestive of gynecological primary, ER 40% week, PR 0%, HER-2 2+ equal vocal, FISH pending    08/25/2018 -  Anti-estrogen oral therapy    Letrozole 2.5 mg daily      MEDICAL HISTORY:  Lower extremity swelling SURGICAL HISTORY: No prior surgeries SOCIAL HISTORY: Denies any tobacco alcohol or recreational drug use FAMILY HISTORY: Heart disease in the family age 64 ALLERGIES:  has no allergies on file.  MEDICATIONS:  Current Outpatient Medications  Medication Sig Dispense Refill  . aspirin EC 81 MG tablet Take 1 tablet (81 mg total) by  mouth daily.    . diclofenac sodium (VOLTAREN) 1 % GEL Apply 2 g topically 4 (four) times daily.    Marland Kitchen doxepin (SINEQUAN) 10 MG capsule Take 1 capsule (10 mg total) by mouth at bedtime.    . famotidine (PEPCID) 20 MG tablet Take 1 tablet (20 mg total) by mouth 2 (two) times daily.    . furosemide (LASIX) 40 MG tablet Take 1 tablet (40 mg total) by mouth daily.    Marland Kitchen glucosamine-chondroitin (MAX GLUCOSAMINE CHONDROITIN) 500-400 MG tablet Take 1 tablet by mouth daily.    Marland Kitchen letrozole (FEMARA) 2.5 MG tablet Take 1 tablet (2.5 mg total) by mouth daily. 90 tablet 3  . Multiple Vitamins-Minerals (MULTIVITAMIN) tablet Take 1 tablet by mouth daily.    Marland Kitchen spironolactone (ALDACTONE) 25 MG tablet Take 1 tablet (25 mg total) by mouth daily.     No current facility-administered medications for this visit.     REVIEW OF SYSTEMS:   Constitutional: Denies fevers, chills or abnormal night sweats Eyes: Denies blurriness of vision, double vision or watery eyes Ears, nose, mouth, throat, and face: Denies mucositis or sore throat Respiratory: Denies cough, dyspnea or wheezes Cardiovascular: Denies palpitation, chest discomfort or lower extremity swelling Gastrointestinal:  Denies nausea, heartburn or change in bowel habits Skin: Denies abnormal skin rashes Lymphatics: Palpable bilateral extra lymphadenopathy and a left inguinal lymphadenopathy Neurological:Denies numbness, tingling or new weaknesses Behavioral/Psych: Mood is stable, no new changes  Breast:  Denies any palpable lumps or discharge, palpable bilateral axillary lymphadenopathy All other systems were reviewed with the patient and are  negative.  PHYSICAL EXAMINATION: ECOG PERFORMANCE STATUS: 1 - Symptomatic but completely ambulatory  Vitals:   08/25/18 1203  BP: 140/81  Pulse: 99  Resp: 17  Temp: (!) 97.5 F (36.4 C)  SpO2: 98%   Filed Weights   08/25/18 1203  Weight: 187 lb 9.6 oz (85.1 kg)    GENERAL:alert, no distress and  comfortable SKIN: skin color, texture, turgor are normal, no rashes or significant lesions EYES: normal, conjunctiva are pink and non-injected, sclera clear OROPHARYNX:no exudate, no erythema and lips, buccal mucosa, and tongue normal  NECK: supple, thyroid normal size, non-tender, without nodularity LYMPH: Palpable bilateral extra lymphadenopathy and left inguinal lymphadenopathy LUNGS: clear to auscultation and percussion with normal breathing effort HEART: regular rate & rhythm and no murmurs and no lower extremity edema ABDOMEN:abdomen soft, non-tender and normal bowel sounds Musculoskeletal:no cyanosis of digits and no clubbing  PSYCH: alert & oriented x 3 with fluent speech NEURO: no focal motor/sensory deficits BREAST:No palpable nodules in breast. No palpable axillary or supraclavicular lymphadenopathy (exam performed in the presence of a chaperone)   LABORATORY DATA:  I have reviewed the data as listed Lab Results  Component Value Date   WBC 5.5 10/18/2007   HGB 13.8 10/18/2007   HCT 42.6 10/18/2007   MCV 97.0 10/18/2007   PLT 254 10/18/2007   Lab Results  Component Value Date   NA 145 10/15/2008   K 4.8 10/15/2008   CL 110 10/15/2008   CO2 21 10/15/2008    RADIOGRAPHIC STUDIES: I have personally reviewed the radiological reports and agreed with the findings in the report.  ASSESSMENT AND PLAN:  Ovarian cancer, left ([facility]) Palpable axillary masses, work-up revealed bilateral inguinal lymph nodes, diffuse abdominal pelvic adenopathy 3.7 cm left inguinal node, porta hepatis lymph node 5.7 cm, periaortic diffuse adenopathy including left ovarian adnexal mass. Biopsy right axilla: High-grade carcinoma possible gynecological primary ER 40% week, PR 0%, HER-2 equivocal 2+ FISH pending  Treatment plan: I discussed with her that we are not certain as to where the primary of her cancer is but it is suspicious for a gynecological primary.  Patient is elderly and has multiple  comorbidities.  She is not a candidate for systemic chemotherapy.  Recommendation: Letrozole 2.5 mg daily. If she does not respond to letrozole she would go under hospice.  Social issues: Patient came in 62 during the [place] along with her husband and son.  Her husband passed away and her son also has lots of health issues.  Return to clinic in 1 month to assess tolerability to letrozole therapy.   All questions were answered. The patient knows to call the clinic with any problems, questions or concerns.    Harriette Ohara, MD 08/25/18

## 2018-08-25 NOTE — Assessment & Plan Note (Signed)
Palpable axillary masses, work-up revealed bilateral inguinal lymph nodes, diffuse abdominal pelvic adenopathy 3.7 cm left inguinal node, porta hepatis lymph node 5.7 cm, periaortic diffuse adenopathy including left ovarian adnexal mass. Biopsy right axilla: High-grade carcinoma possible gynecological primary ER 40% week, PR 0%, HER-2 equivocal 2+ FISH pending  Treatment plan: I discussed with her that we are not certain as to where the primary of her cancer is but it is suspicious for a gynecological primary.  Patient is elderly and has multiple comorbidities.  She is not a candidate for systemic chemotherapy.  Recommendation: Letrozole 2.5 mg daily. If she does not respond to letrozole she would go under hospice.  Social issues: Patient came in 14 during the [place] along with her husband and son.  Her husband passed away and her son also has lots of health issues.  Return to clinic in 1 month to assess tolerability to letrozole therapy.

## 2018-08-25 NOTE — Telephone Encounter (Signed)
Gave patient avs and calendar (w/ interpreter).

## 2018-09-20 ENCOUNTER — Telehealth: Payer: Self-pay | Admitting: Hematology and Oncology

## 2018-09-20 NOTE — Telephone Encounter (Signed)
VG out 11/15 - moved f/u to 11/26. Left message for patient. Schedule mailed.

## 2018-09-22 ENCOUNTER — Inpatient Hospital Stay: Payer: Medicare Other | Admitting: Hematology and Oncology

## 2018-09-28 NOTE — Progress Notes (Signed)
Patient Care Team: Nolene Ebbs, MD as PCP - General (Internal Medicine)  DIAGNOSIS:    ICD-10-CM   1. Ovarian cancer, left ([facility]) C56.2     SUMMARY OF ONCOLOGIC HISTORY:   Ovarian cancer, left ([facility])   08/18/2018 Initial Diagnosis    Right axillary lymph node biopsy: High-grade carcinoma.  CK7, PAX 8, WT 1, ER and GA TA-3 are positive, suggestive of gynecological primary, ER 40% week, PR 0%, HER-2 2+ equal vocal, FISH positive ratio 2.22, average copy #5.1    08/25/2018 -  Anti-estrogen oral therapy    Letrozole 2.5 mg daily     CHIEF COMPLIANT: Follow-up for Letrozole therapy  INTERVAL HISTORY: Christina [name] is a 84 y.o. with above-mentioned history of bilateral axillary lymphadenopathy and biopsy of which suggest gynecological primary. She is currently on Letrozole therapy, which was started 08/25/18. She presents to the clinic today with her family member. A translator was used for the visit. She reports she has lost her appetite recently and has had intense nausea, although she refused to take a nausea medication. She reports she stopped taking Letrozole 4 days ago because it made her unable to sleep, gave her pain in her back and chest, and constipation in addition to the nausea and loss of appetite but the symptoms have persisted. She expressed a desire to change treatment to something with less side effects.  She was feeling extremely miserable and does not want to continue with letrozole therapy.   REVIEW OF SYSTEMS:   Constitutional: Denies fevers, chills or abnormal weight loss (+) loss of appetite Eyes: Denies blurriness of vision Ears, nose, mouth, throat, and face: Denies mucositis or sore throat Respiratory: Denies cough, dyspnea or wheezes Cardiovascular: Denies palpitation (+) chest pain Gastrointestinal:  Denies heartburn (+) intense nausea (+) constant constipation Skin: Denies abnormal skin rashes MSK: (+) back pain Lymphatics: Denies new lymphadenopathy or  easy bruising Neurological: Denies numbness, tingling or new weaknesses Behavioral/Psych: Mood is stable, no new changes (+) difficulty sleeping Extremities: No lower extremity edema Breast: denies any pain or lumps or nodules in either breasts All other systems were reviewed with the patient and are negative.  I have reviewed the past medical history, past surgical history, social history and family history with the patient and they are unchanged from previous note.  ALLERGIES:  has no allergies on file.  MEDICATIONS:  Current Outpatient Medications  Medication Sig Dispense Refill  . aspirin EC 81 MG tablet Take 1 tablet (81 mg total) by mouth daily.    Marland Kitchen dexamethasone (DECADRON) 1 MG tablet Take 4 tablets (4 mg total) by mouth daily. 30 tablet 3  . diclofenac sodium (VOLTAREN) 1 % GEL Apply 2 g topically 4 (four) times daily.    Marland Kitchen doxepin (SINEQUAN) 10 MG capsule Take 1 capsule (10 mg total) by mouth at bedtime.    . famotidine (PEPCID) 20 MG tablet Take 1 tablet (20 mg total) by mouth 2 (two) times daily.    . furosemide (LASIX) 40 MG tablet Take 1 tablet (40 mg total) by mouth daily.    Marland Kitchen glucosamine-chondroitin (MAX GLUCOSAMINE CHONDROITIN) 500-400 MG tablet Take 1 tablet by mouth daily.    Marland Kitchen letrozole (FEMARA) 2.5 MG tablet Take 1 tablet (2.5 mg total) by mouth daily. 90 tablet 3  . Multiple Vitamins-Minerals (MULTIVITAMIN) tablet Take 1 tablet by mouth daily.    . ondansetron (ZOFRAN ODT) 4 MG disintegrating tablet Take 1 tablet (4 mg total) by mouth every 8 (eight) hours  as needed for nausea or vomiting. 20 tablet 0  . spironolactone (ALDACTONE) 25 MG tablet Take 1 tablet (25 mg total) by mouth daily.     No current facility-administered medications for this visit.     PHYSICAL EXAMINATION: ECOG PERFORMANCE STATUS: 3 - Symptomatic, >50% confined to bed  Vitals:   10/03/18 1117  BP: (!) 129/105  Pulse: (!) 101  Resp: 19  Temp: (!) 97.4 F (36.3 C)  SpO2: 96%   There  were no vitals filed for this visit.  GENERAL: Uncomfortable feeling of nausea SKIN: skin color, texture, turgor are normal, no rashes or significant lesions EYES: normal, Conjunctiva are pink and non-injected, sclera clear OROPHARYNX:no exudate, no erythema and lips, buccal mucosa, and tongue normal  NECK: supple, thyroid normal size, non-tender, without nodularity LYMPH:  no palpable lymphadenopathy in the cervical, axillary or inguinal LUNGS: clear to auscultation and percussion with normal breathing effort HEART: regular rate & rhythm and no murmurs and no lower extremity edema ABDOMEN: Distended MUSCULOSKELETAL:no cyanosis of digits and no clubbing  NEURO: alert & oriented x 3 with fluent speech, no focal motor/sensory deficits EXTREMITIES: No lower extremity edema  LABORATORY DATA:  I have reviewed the data as listed CMP Latest Ref Rng & Units 10/15/2008 10/18/2007  Glucose 70 - 99 mg/dL 95 80  BUN 6 - 23 mg/dL 11 12  Creatinine 0.40 - 1.20 mg/dL 0.75 0.71  Sodium 135 - 145 meq/L 145 143  Potassium 3.5 - 5.3 meq/L 4.8 4.3  Chloride 96 - 112 meq/L 110 108  CO2 19 - 32 meq/L 21 24  Calcium 8.4 - 10.5 mg/dL 9.0 9.1  Total Protein 6.0 - 8.3 g/dL 6.9 7.1  Total Bilirubin 0.3 - 1.2 mg/dL 0.7 0.6  Alkaline Phos 39 - 117 units/L 77 94  AST 0 - 37 units/L 19 14  ALT 0 - 35 units/L 10 12    Lab Results  Component Value Date   WBC 5.5 10/18/2007   HGB 13.8 10/18/2007   HCT 42.6 10/18/2007   MCV 97.0 10/18/2007   PLT 254 10/18/2007   NEUTROABS 3.9 10/18/2007    ASSESSMENT & PLAN:  Ovarian cancer, left ([facility]) Palpable axillary masses, work-up revealed bilateral inguinal lymph nodes, diffuse abdominal pelvic adenopathy 3.7 cm left inguinal node, porta hepatis lymph node 5.7 cm, periaortic diffuse adenopathy including left ovarian adnexal mass. Biopsy right axilla: High-grade carcinoma possible gynecological primary ER 40% week, PR 0%, HER-2 equivocal 2+ FISH positive ratio 2.22,  copy #5.1  Current treatment: Because she is not a candidate for systemic chemotherapy patient is currently on letrozole 2.5 mg daily started 08/25/2018 discontinued 09/27/2018 Letrozole toxicities: 1.  Nausea and vomiting 2. Insomnia 3.  Generalized fatigue and weakness She stopped taking it 4 days ago and still does not feel any better. I discussed with her that these symptoms may be related to her progression of cancer rather than letrozole therapy. I discussed with her that adding an anti-HER-2 therapy is another option but she does not want to take it anything in the vein.  They want to try something orally. I do not believe that she would be able to tolerate lapatinib which would have more significant diarrhea and other side effects.   Social issues: Patient came in 76 during the [place] along with her husband and son.  Her husband passed away and her son also has lots of health issues.  Based upon overall concerns, I recommended hospice care. I gave her prescription  for dexamethasone to improve her appetite Prescription for Zofran ODT for nausea  Return to clinic in 1 month to assess her symptoms.  No orders of the defined types were placed in this encounter.  The patient has a good understanding of the overall plan. she agrees with it. she will call with any problems that may develop before the next visit here.  Nicholas Lose, MD 10/03/2018   I, Cloyde Reams Dorshimer, am acting as scribe for Nicholas Lose, MD.  I have reviewed the above documentation for accuracy and completeness, and I agree with the above.

## 2018-09-29 ENCOUNTER — Telehealth: Payer: Self-pay

## 2018-09-29 NOTE — Telephone Encounter (Signed)
Pt interpreter called to report that pt is unable to take letrozole and that she will no longer take it, until she sees Dr.Gudena next week. Pt is having severe aches/pains, insomnia, constipation and abdominal discomfort. Pt scheduled to see MD on tues 11/26. Told interpreter that it is fine to have pt stop letrozole until seen by MD. Dr.Gudena will be made aware.

## 2018-10-03 ENCOUNTER — Inpatient Hospital Stay: Payer: Medicare Other | Attending: Hematology and Oncology | Admitting: Hematology and Oncology

## 2018-10-03 ENCOUNTER — Telehealth: Payer: Self-pay | Admitting: Hematology and Oncology

## 2018-10-03 ENCOUNTER — Other Ambulatory Visit: Payer: Self-pay

## 2018-10-03 DIAGNOSIS — F5089 Other specified eating disorder: Secondary | ICD-10-CM | POA: Diagnosis not present

## 2018-10-03 DIAGNOSIS — R591 Generalized enlarged lymph nodes: Secondary | ICD-10-CM

## 2018-10-03 DIAGNOSIS — C773 Secondary and unspecified malignant neoplasm of axilla and upper limb lymph nodes: Secondary | ICD-10-CM | POA: Insufficient documentation

## 2018-10-03 DIAGNOSIS — Z79811 Long term (current) use of aromatase inhibitors: Secondary | ICD-10-CM | POA: Insufficient documentation

## 2018-10-03 DIAGNOSIS — Z170 Estrogen receptor positive status [ER+]: Secondary | ICD-10-CM | POA: Diagnosis not present

## 2018-10-03 DIAGNOSIS — R110 Nausea: Secondary | ICD-10-CM | POA: Diagnosis not present

## 2018-10-03 DIAGNOSIS — C562 Malignant neoplasm of left ovary: Secondary | ICD-10-CM | POA: Diagnosis present

## 2018-10-03 MED ORDER — ONDANSETRON 4 MG PO TBDP: 4.0000 mg | ORAL_TABLET | Freq: Three times a day (TID) | ORAL | 0 refills | Status: DC | PRN

## 2018-10-03 MED ORDER — DEXAMETHASONE 1 MG PO TABS: 4.0000 mg | ORAL_TABLET | Freq: Every day | ORAL | 3 refills | Status: AC

## 2018-10-03 NOTE — Telephone Encounter (Signed)
Gave avs and calendar ° °

## 2018-10-03 NOTE — Progress Notes (Signed)
Per Dr.Gudena, referral to hospice called in, with bosn[place] interpreter.

## 2018-10-03 NOTE — Assessment & Plan Note (Signed)
Palpable axillary masses, work-up revealed bilateral inguinal lymph nodes, diffuse abdominal pelvic adenopathy 3.7 cm left inguinal node, porta hepatis lymph node 5.7 cm, periaortic diffuse adenopathy including left ovarian adnexal mass. Biopsy right axilla: High-grade carcinoma possible gynecological primary ER 40% week, PR 0%, HER-2 equivocal 2+ FISH positive ratio 2.22, copy #5.1  Current treatment: Because she is not a candidate for systemic chemotherapy patient is currently on letrozole 2.5 mg daily started 08/25/2018  Pathology review: I discussed the HER-2 positive test result.  Options include adding intravenous Herceptin to letrozole or continuation of letrozole therapy.  Social issues: Patient came in 63 during the [place] along with her husband and son.  Her husband passed away and her son also has lots of health issues.

## 2018-10-10 ENCOUNTER — Other Ambulatory Visit: Payer: Self-pay

## 2018-10-10 ENCOUNTER — Encounter (HOSPITAL_COMMUNITY): Payer: Self-pay | Admitting: Emergency Medicine

## 2018-10-10 ENCOUNTER — Emergency Department (HOSPITAL_COMMUNITY)
Admission: EM | Admit: 2018-10-10 | Discharge: 2018-10-11 | Disposition: A | Discharge status: Home / Self Care | Payer: Medicare Other | Attending: Emergency Medicine | Admitting: Emergency Medicine

## 2018-10-10 DIAGNOSIS — Z7982 Long term (current) use of aspirin: Secondary | ICD-10-CM | POA: Insufficient documentation

## 2018-10-10 DIAGNOSIS — Z8543 Personal history of malignant neoplasm of ovary: Secondary | ICD-10-CM | POA: Diagnosis not present

## 2018-10-10 DIAGNOSIS — N289 Disorder of kidney and ureter, unspecified: Secondary | ICD-10-CM

## 2018-10-10 DIAGNOSIS — J90 Pleural effusion, not elsewhere classified: Secondary | ICD-10-CM | POA: Diagnosis not present

## 2018-10-10 DIAGNOSIS — R112 Nausea with vomiting, unspecified: Secondary | ICD-10-CM | POA: Diagnosis present

## 2018-10-10 DIAGNOSIS — Z79899 Other long term (current) drug therapy: Secondary | ICD-10-CM | POA: Diagnosis not present

## 2018-10-10 DIAGNOSIS — E871 Hypo-osmolality and hyponatremia: Secondary | ICD-10-CM | POA: Diagnosis not present

## 2018-10-10 DIAGNOSIS — R600 Localized edema: Secondary | ICD-10-CM | POA: Diagnosis not present

## 2018-10-10 DIAGNOSIS — R609 Edema, unspecified: Secondary | ICD-10-CM

## 2018-10-10 HISTORY — DX: Malignant (primary) neoplasm, unspecified: C80.1

## 2018-10-10 NOTE — ED Triage Notes (Signed)
Pt arriving via [facility] from home for N/V and bilateral leg swelling. Pt reports she has had these symptoms increase over the last few days. Pt states her symptoms first started in October when she began chemo treatment for ovarian cancer.

## 2018-10-11 ENCOUNTER — Telehealth: Payer: Self-pay

## 2018-10-11 ENCOUNTER — Emergency Department (HOSPITAL_COMMUNITY): Payer: Medicare Other

## 2018-10-11 DIAGNOSIS — R112 Nausea with vomiting, unspecified: Secondary | ICD-10-CM | POA: Diagnosis not present

## 2018-10-11 LAB — COMPREHENSIVE METABOLIC PANEL
ALT: 18 U/L (ref 0–44)
AST: 31 U/L (ref 15–41)
Albumin: 3 g/dL — ABNORMAL LOW (ref 3.5–5.0)
Alkaline Phosphatase: 103 U/L (ref 38–126)
Anion gap: 12 (ref 5–15)
BUN: 58 mg/dL — AB (ref 8–23)
CHLORIDE: 91 mmol/L — AB (ref 98–111)
CO2: 28 mmol/L (ref 22–32)
CREATININE: 1.62 mg/dL — AB (ref 0.44–1.00)
Calcium: 10.3 mg/dL (ref 8.9–10.3)
GFR calc Af Amer: 33 mL/min — ABNORMAL LOW (ref >60)
GFR, EST NON AFRICAN AMERICAN: 29 mL/min — AB (ref >60)
Glucose, Bld: 105 mg/dL — ABNORMAL HIGH (ref 70–99)
Potassium: 4.3 mmol/L (ref 3.5–5.1)
SODIUM: 131 mmol/L — AB (ref 135–145)
Total Bilirubin: 0.8 mg/dL (ref 0.3–1.2)
Total Protein: 7.9 g/dL (ref 6.5–8.1)

## 2018-10-11 LAB — CBC WITH DIFFERENTIAL/PLATELET
Abs Immature Granulocytes: 0.04 10*3/uL (ref 0.00–0.07)
Basophils Absolute: 0 10*3/uL (ref 0.0–0.1)
Basophils Relative: 0 %
Eosinophils Absolute: 0 10*3/uL (ref 0.0–0.5)
Eosinophils Relative: 0 %
HEMATOCRIT: 44.2 % (ref 36.0–46.0)
HEMOGLOBIN: 14.3 g/dL (ref 12.0–15.0)
Immature Granulocytes: 0 %
LYMPHS ABS: 0.8 10*3/uL (ref 0.7–4.0)
LYMPHS PCT: 7 %
MCH: 30.5 pg (ref 26.0–34.0)
MCHC: 32.4 g/dL (ref 30.0–36.0)
MCV: 94.2 fL (ref 80.0–100.0)
MONO ABS: 0.9 10*3/uL (ref 0.1–1.0)
MONOS PCT: 8 %
Neutro Abs: 8.9 10*3/uL — ABNORMAL HIGH (ref 1.7–7.7)
Neutrophils Relative %: 85 %
Platelets: 517 10*3/uL — ABNORMAL HIGH (ref 150–400)
RBC: 4.69 MIL/uL (ref 3.87–5.11)
RDW: 13.3 % (ref 11.5–15.5)
WBC: 10.6 10*3/uL — ABNORMAL HIGH (ref 4.0–10.5)
nRBC: 0 % (ref 0.0–0.2)

## 2018-10-11 LAB — BRAIN NATRIURETIC PEPTIDE: B Natriuretic Peptide: 628.6 pg/mL — ABNORMAL HIGH (ref 0.0–100.0)

## 2018-10-11 LAB — TROPONIN I: Troponin I: <0.03 ng/mL (ref <0.03)

## 2018-10-11 MED ORDER — FUROSEMIDE 20 MG PO TABS: 60.0000 mg | ORAL_TABLET | Freq: Every day | ORAL | 0 refills | Status: AC | PRN

## 2018-10-11 MED ORDER — FUROSEMIDE 10 MG/ML IJ SOLN
40.0000 mg | Freq: Once | INTRAMUSCULAR | Status: AC
  Administered 2018-10-11: 40 mg via INTRAVENOUS
  Filled 2018-10-11: qty 4

## 2018-10-11 MED ORDER — ONDANSETRON HCL 4 MG PO TABS: 4.0000 mg | ORAL_TABLET | Freq: Four times a day (QID) | ORAL | 0 refills | Status: AC | PRN

## 2018-10-11 MED ORDER — ONDANSETRON HCL 4 MG/2ML IJ SOLN
4.0000 mg | Freq: Once | INTRAMUSCULAR | Status: AC
  Administered 2018-10-11: 4 mg via INTRAVENOUS
  Filled 2018-10-11: qty 2

## 2018-10-11 NOTE — Telephone Encounter (Signed)
Hospice went out to admit the patient today with a interpretor to hospice services today. She refused services at this time. She has restarted Femara and is taking the antiemetic. She is feeling better.

## 2018-10-11 NOTE — Discharge Instructions (Addendum)
Please increase your furosemide (Lasix) to 3 tablets (60 mg) every day.  Please try to limit your salt intake.  Return if symptoms are not being adequately controlled at home.

## 2018-10-11 NOTE — ED Provider Notes (Signed)
[facility] DEPT Provider Note   CSN: [id number] Arrival date & time: 10/10/18  2308     History   Chief Complaint Chief Complaint  Patient presents with  . Emesis  . Leg Swelling    HPI Christina [name] is a 84 y.o. female.  The history is provided by the patient.  She has a history of ovarian cancer and comes in complaining of nausea, vomiting, leg swelling.  She is somewhat vague on how long symptoms have been present.  She is also complaining of constipation.  She denies dyspnea and denies chest pain.  There is some vague abdominal discomfort but no true abdominal pain.  She denies fever or chills.  She is currently taking letrozole for her cancer.  Past Medical History:  Diagnosis Date  . Cancer ([place]) 08/2018   ovarian    Patient Active Problem List   Diagnosis Date Noted  . Ovarian cancer, left ([place]) 08/25/2018  . Chronic pain of left knee 03/17/2017  . Chronic pain of right knee 03/17/2017    No past surgical history on file.   OB History   None      Home Medications    Prior to Admission medications   Medication Sig Start Date End Date Taking? Authorizing Provider  aspirin EC 81 MG tablet Take 1 tablet (81 mg total) by mouth daily. 08/25/18   Nicholas Lose, MD  dexamethasone (DECADRON) 1 MG tablet Take 4 tablets (4 mg total) by mouth daily. 10/03/18   Nicholas Lose, MD  diclofenac sodium (VOLTAREN) 1 % GEL Apply 2 g topically 4 (four) times daily. 08/25/18   Nicholas Lose, MD  doxepin (SINEQUAN) 10 MG capsule Take 1 capsule (10 mg total) by mouth at bedtime. 08/25/18   Nicholas Lose, MD  famotidine (PEPCID) 20 MG tablet Take 1 tablet (20 mg total) by mouth 2 (two) times daily. 08/25/18   Nicholas Lose, MD  furosemide (LASIX) 40 MG tablet Take 1 tablet (40 mg total) by mouth daily. 08/25/18   Nicholas Lose, MD  glucosamine-chondroitin (MAX GLUCOSAMINE CHONDROITIN) 500-400 MG tablet Take 1 tablet by mouth daily. 08/25/18   Nicholas Lose, MD  letrozole (FEMARA) 2.5 MG tablet Take 1 tablet (2.5 mg total) by mouth daily. 08/25/18   Nicholas Lose, MD  Multiple Vitamins-Minerals (MULTIVITAMIN) tablet Take 1 tablet by mouth daily. 08/25/18   Nicholas Lose, MD  ondansetron (ZOFRAN ODT) 4 MG disintegrating tablet Take 1 tablet (4 mg total) by mouth every 8 (eight) hours as needed for nausea or vomiting. 10/03/18   Nicholas Lose, MD  spironolactone (ALDACTONE) 25 MG tablet Take 1 tablet (25 mg total) by mouth daily. 08/25/18   Nicholas Lose, MD    Family History No family history on file.  Social History Social History   Tobacco Use  . Smoking status: Not on file  Substance Use Topics  . Alcohol use: Not on file  . Drug use: Not on file     Allergies   Patient has no known allergies.   Review of Systems Review of Systems  All other systems reviewed and are negative.    Physical Exam Updated Vital Signs BP (!) 118/50 (BP Location: Left Arm)   Pulse 68   Temp 97.6 F (36.4 C) (Oral)   Resp 16   Ht 5\' 5"  (1.651 m)   Wt 87.1 kg   SpO2 94%   BMI 31.95 kg/m   Physical Exam  Nursing note and vitals reviewed.  84 year  old female, appears dyspneic at rest, but is in no acute distress. Vital signs are normal. Oxygen saturation is 94%, which is normal. Head is normocephalic and atraumatic. PERRLA, EOMI. Oropharynx is clear. Neck is nontender and supple without adenopathy or JVD. Back is nontender and there is no CVA tenderness. Lungs are clear without rales, wheezes, or rhonchi. Chest is nontender. Heart has regular rate and rhythm without murmur. Abdomen is mildly distended with some vague fullness in the right lower quadrant but no discrete masses or hepatosplenomegaly and peristalsis is hypoactive. Extremities have 3+ edema, full range of motion is present. Skin is warm and dry without rash. Neurologic: Mental status is normal, cranial nerves are intact, there are no motor or sensory deficits.  ED  Treatments / Results  Labs (all labs ordered are listed, but only abnormal results are displayed) Labs Reviewed - No data to display  EKG None  Radiology No results found.  Procedures Procedures   Medications Ordered in ED Medications - No data to display   Initial Impression / Assessment and Plan / ED Course  I have reviewed the triage vital signs and the nursing notes.  Pertinent labs & imaging results that were available during my care of the patient were reviewed by me and considered in my medical decision making (see chart for details).  Peripheral edema possibly related to ovarian cancer.  Nausea with vomiting.  Old records are reviewed, and apparently nausea started after she started letrozole and she thinks it may be related to that.  However, nausea is not a common side effect of letrozole, and I suspect is related to progression of her cancer.  Will check screening labs and chest x-ray.  She will be given a dose of ondansetron.  Chest x-ray shows a left pleural effusion.  Labs show elevated creatinine compared with the last value on record, but that was 10 years ago.  Hemoglobin is normal.  BNP is moderately elevated.  She is given a dose of furosemide intravenously.  Nausea has been adequately controlled with ondansetron.  She is discharged with prescription for ondansetron.  She is to increase her furosemide dose from 20-40 mg a day, to 60 mg a day.  Follow-up with PCP in 3 days.  Will need to follow creatinine closely.  Final Clinical Impressions(s) / ED Diagnoses   Final diagnoses:  Non-intractable vomiting with nausea, unspecified vomiting type  Peripheral edema  Pleural effusion, left  Renal insufficiency  Hyponatremia    ED Discharge Orders         Ordered    furosemide (LASIX) 20 MG tablet  Daily PRN     10/11/18 0426    ondansetron (ZOFRAN) 4 MG tablet  Every 6 hours PRN     10/11/18 9417           Delora Fuel, MD 40/81/44 [phone]

## 2018-10-17 ENCOUNTER — Emergency Department (HOSPITAL_COMMUNITY): Payer: Medicare Other

## 2018-10-17 ENCOUNTER — Inpatient Hospital Stay (HOSPITAL_COMMUNITY)
Admission: EM | Admit: 2018-10-17 | Discharge: 2018-11-08 | DRG: 871 | Disposition: E | Payer: Medicare Other | Attending: Internal Medicine | Admitting: Internal Medicine

## 2018-10-17 ENCOUNTER — Encounter (HOSPITAL_COMMUNITY): Payer: Self-pay | Admitting: Radiology

## 2018-10-17 DIAGNOSIS — C562 Malignant neoplasm of left ovary: Secondary | ICD-10-CM | POA: Diagnosis present

## 2018-10-17 DIAGNOSIS — Z8543 Personal history of malignant neoplasm of ovary: Secondary | ICD-10-CM

## 2018-10-17 DIAGNOSIS — Z7189 Other specified counseling: Secondary | ICD-10-CM

## 2018-10-17 DIAGNOSIS — N3000 Acute cystitis without hematuria: Secondary | ICD-10-CM

## 2018-10-17 DIAGNOSIS — R112 Nausea with vomiting, unspecified: Secondary | ICD-10-CM

## 2018-10-17 DIAGNOSIS — Z79811 Long term (current) use of aromatase inhibitors: Secondary | ICD-10-CM

## 2018-10-17 DIAGNOSIS — Z634 Disappearance and death of family member: Secondary | ICD-10-CM

## 2018-10-17 DIAGNOSIS — R0902 Hypoxemia: Secondary | ICD-10-CM | POA: Diagnosis present

## 2018-10-17 DIAGNOSIS — N390 Urinary tract infection, site not specified: Secondary | ICD-10-CM | POA: Diagnosis not present

## 2018-10-17 DIAGNOSIS — R040 Epistaxis: Secondary | ICD-10-CM | POA: Diagnosis present

## 2018-10-17 DIAGNOSIS — Z8249 Family history of ischemic heart disease and other diseases of the circulatory system: Secondary | ICD-10-CM

## 2018-10-17 DIAGNOSIS — R7989 Other specified abnormal findings of blood chemistry: Secondary | ICD-10-CM | POA: Diagnosis present

## 2018-10-17 DIAGNOSIS — A419 Sepsis, unspecified organism: Secondary | ICD-10-CM | POA: Diagnosis not present

## 2018-10-17 DIAGNOSIS — I493 Ventricular premature depolarization: Secondary | ICD-10-CM | POA: Diagnosis present

## 2018-10-17 DIAGNOSIS — G9341 Metabolic encephalopathy: Secondary | ICD-10-CM | POA: Diagnosis present

## 2018-10-17 DIAGNOSIS — Z9841 Cataract extraction status, right eye: Secondary | ICD-10-CM

## 2018-10-17 DIAGNOSIS — Z66 Do not resuscitate: Secondary | ICD-10-CM | POA: Diagnosis present

## 2018-10-17 DIAGNOSIS — N179 Acute kidney failure, unspecified: Secondary | ICD-10-CM | POA: Diagnosis not present

## 2018-10-17 DIAGNOSIS — F329 Major depressive disorder, single episode, unspecified: Secondary | ICD-10-CM | POA: Diagnosis present

## 2018-10-17 DIAGNOSIS — J342 Deviated nasal septum: Secondary | ICD-10-CM | POA: Diagnosis present

## 2018-10-17 DIAGNOSIS — N281 Cyst of kidney, acquired: Secondary | ICD-10-CM | POA: Diagnosis present

## 2018-10-17 DIAGNOSIS — C7989 Secondary malignant neoplasm of other specified sites: Secondary | ICD-10-CM | POA: Diagnosis present

## 2018-10-17 DIAGNOSIS — R778 Other specified abnormalities of plasma proteins: Secondary | ICD-10-CM | POA: Diagnosis present

## 2018-10-17 DIAGNOSIS — I82429 Acute embolism and thrombosis of unspecified iliac vein: Secondary | ICD-10-CM | POA: Diagnosis present

## 2018-10-17 DIAGNOSIS — E785 Hyperlipidemia, unspecified: Secondary | ICD-10-CM | POA: Diagnosis present

## 2018-10-17 DIAGNOSIS — J90 Pleural effusion, not elsewhere classified: Secondary | ICD-10-CM | POA: Diagnosis present

## 2018-10-17 DIAGNOSIS — E870 Hyperosmolality and hypernatremia: Secondary | ICD-10-CM | POA: Diagnosis present

## 2018-10-17 DIAGNOSIS — I82811 Embolism and thrombosis of superficial veins of right lower extremities: Secondary | ICD-10-CM | POA: Diagnosis present

## 2018-10-17 DIAGNOSIS — R590 Localized enlarged lymph nodes: Secondary | ICD-10-CM | POA: Diagnosis present

## 2018-10-17 DIAGNOSIS — Z961 Presence of intraocular lens: Secondary | ICD-10-CM | POA: Diagnosis present

## 2018-10-17 DIAGNOSIS — J690 Pneumonitis due to inhalation of food and vomit: Secondary | ICD-10-CM | POA: Diagnosis not present

## 2018-10-17 DIAGNOSIS — C569 Malignant neoplasm of unspecified ovary: Secondary | ICD-10-CM | POA: Diagnosis present

## 2018-10-17 DIAGNOSIS — I700 Atherosclerosis of aorta: Secondary | ICD-10-CM | POA: Diagnosis present

## 2018-10-17 DIAGNOSIS — N183 Chronic kidney disease, stage 3 unspecified: Secondary | ICD-10-CM | POA: Diagnosis present

## 2018-10-17 DIAGNOSIS — C786 Secondary malignant neoplasm of retroperitoneum and peritoneum: Secondary | ICD-10-CM | POA: Diagnosis present

## 2018-10-17 DIAGNOSIS — Z7982 Long term (current) use of aspirin: Secondary | ICD-10-CM

## 2018-10-17 DIAGNOSIS — C801 Malignant (primary) neoplasm, unspecified: Secondary | ICD-10-CM

## 2018-10-17 DIAGNOSIS — I248 Other forms of acute ischemic heart disease: Secondary | ICD-10-CM | POA: Diagnosis present

## 2018-10-17 DIAGNOSIS — R008 Other abnormalities of heart beat: Secondary | ICD-10-CM | POA: Diagnosis present

## 2018-10-17 DIAGNOSIS — D631 Anemia in chronic kidney disease: Secondary | ICD-10-CM | POA: Diagnosis present

## 2018-10-17 DIAGNOSIS — Z8673 Personal history of transient ischemic attack (TIA), and cerebral infarction without residual deficits: Secondary | ICD-10-CM

## 2018-10-17 DIAGNOSIS — Z7952 Long term (current) use of systemic steroids: Secondary | ICD-10-CM

## 2018-10-17 DIAGNOSIS — I472 Ventricular tachycardia: Secondary | ICD-10-CM | POA: Diagnosis present

## 2018-10-17 DIAGNOSIS — R1110 Vomiting, unspecified: Secondary | ICD-10-CM

## 2018-10-17 DIAGNOSIS — R109 Unspecified abdominal pain: Secondary | ICD-10-CM

## 2018-10-17 DIAGNOSIS — K429 Umbilical hernia without obstruction or gangrene: Secondary | ICD-10-CM | POA: Diagnosis present

## 2018-10-17 DIAGNOSIS — I82413 Acute embolism and thrombosis of femoral vein, bilateral: Secondary | ICD-10-CM | POA: Diagnosis present

## 2018-10-17 DIAGNOSIS — R0600 Dyspnea, unspecified: Secondary | ICD-10-CM

## 2018-10-17 DIAGNOSIS — D696 Thrombocytopenia, unspecified: Secondary | ICD-10-CM | POA: Diagnosis present

## 2018-10-17 DIAGNOSIS — Z515 Encounter for palliative care: Secondary | ICD-10-CM

## 2018-10-17 DIAGNOSIS — I129 Hypertensive chronic kidney disease with stage 1 through stage 4 chronic kidney disease, or unspecified chronic kidney disease: Secondary | ICD-10-CM | POA: Diagnosis present

## 2018-10-17 DIAGNOSIS — R591 Generalized enlarged lymph nodes: Secondary | ICD-10-CM | POA: Diagnosis present

## 2018-10-17 DIAGNOSIS — K920 Hematemesis: Secondary | ICD-10-CM | POA: Diagnosis present

## 2018-10-17 DIAGNOSIS — E860 Dehydration: Secondary | ICD-10-CM | POA: Diagnosis present

## 2018-10-17 DIAGNOSIS — R079 Chest pain, unspecified: Secondary | ICD-10-CM | POA: Diagnosis present

## 2018-10-17 DIAGNOSIS — R600 Localized edema: Secondary | ICD-10-CM | POA: Diagnosis present

## 2018-10-17 LAB — URINALYSIS, ROUTINE W REFLEX MICROSCOPIC
Bilirubin Urine: NEGATIVE
Glucose, UA: NEGATIVE mg/dL
Ketones, ur: NEGATIVE mg/dL
Nitrite: NEGATIVE
Protein, ur: NEGATIVE mg/dL
Specific Gravity, Urine: 1.015 (ref 1.005–1.030)
pH: 5 (ref 5.0–8.0)

## 2018-10-17 LAB — HEPATIC FUNCTION PANEL
ALT: 18 U/L (ref 0–44)
AST: 35 U/L (ref 15–41)
Albumin: 2.8 g/dL — ABNORMAL LOW (ref 3.5–5.0)
Alkaline Phosphatase: 100 U/L (ref 38–126)
Bilirubin, Direct: 0.3 mg/dL — ABNORMAL HIGH (ref 0.0–0.2)
Indirect Bilirubin: 1.4 mg/dL — ABNORMAL HIGH (ref 0.3–0.9)
Total Bilirubin: 1.7 mg/dL — ABNORMAL HIGH (ref 0.3–1.2)
Total Protein: 6.9 g/dL (ref 6.5–8.1)

## 2018-10-17 LAB — BASIC METABOLIC PANEL
ANION GAP: 16 — AB (ref 5–15)
BUN: 101 mg/dL — ABNORMAL HIGH (ref 8–23)
CO2: 26 mmol/L (ref 22–32)
Calcium: 10.9 mg/dL — ABNORMAL HIGH (ref 8.9–10.3)
Chloride: 93 mmol/L — ABNORMAL LOW (ref 98–111)
Creatinine, Ser: 2.43 mg/dL — ABNORMAL HIGH (ref 0.44–1.00)
GFR calc non Af Amer: 18 mL/min — ABNORMAL LOW (ref >60)
GFR, EST AFRICAN AMERICAN: 20 mL/min — AB (ref >60)
Glucose, Bld: 121 mg/dL — ABNORMAL HIGH (ref 70–99)
Potassium: 4 mmol/L (ref 3.5–5.1)
Sodium: 135 mmol/L (ref 135–145)

## 2018-10-17 LAB — TROPONIN I: Troponin I: 0.05 ng/mL (ref <0.03)

## 2018-10-17 LAB — I-STAT CG4 LACTIC ACID, ED
LACTIC ACID, VENOUS: 3.35 mmol/L — AB (ref 0.5–1.9)
Lactic Acid, Venous: 3.86 mmol/L (ref 0.5–1.9)

## 2018-10-17 LAB — CBC
HCT: 51.4 % — ABNORMAL HIGH (ref 36.0–46.0)
Hemoglobin: 16.5 g/dL — ABNORMAL HIGH (ref 12.0–15.0)
MCH: 30.8 pg (ref 26.0–34.0)
MCHC: 32.1 g/dL (ref 30.0–36.0)
MCV: 96.1 fL (ref 80.0–100.0)
PLATELETS: 160 10*3/uL (ref 150–400)
RBC: 5.35 MIL/uL — ABNORMAL HIGH (ref 3.87–5.11)
RDW: 14.6 % (ref 11.5–15.5)
WBC: 18.1 10*3/uL — ABNORMAL HIGH (ref 4.0–10.5)
nRBC: 0 % (ref 0.0–0.2)

## 2018-10-17 LAB — BRAIN NATRIURETIC PEPTIDE: B Natriuretic Peptide: 151.7 pg/mL — ABNORMAL HIGH (ref 0.0–100.0)

## 2018-10-17 MED ORDER — SODIUM CHLORIDE 0.9 % IV SOLN
1.0000 g | Freq: Once | INTRAVENOUS | Status: AC
  Administered 2018-10-17: 1 g via INTRAVENOUS
  Filled 2018-10-17: qty 10

## 2018-10-17 MED ORDER — LACTATED RINGERS IV BOLUS
500.0000 mL | Freq: Once | INTRAVENOUS | Status: AC
  Administered 2018-10-17: 500 mL via INTRAVENOUS

## 2018-10-17 MED ORDER — LACTATED RINGERS IV BOLUS
1000.0000 mL | Freq: Once | INTRAVENOUS | Status: AC
  Administered 2018-10-17: 1000 mL via INTRAVENOUS

## 2018-10-17 NOTE — ED Triage Notes (Signed)
Transported by [facility] from home-- hx of ovarian/ uterine cancer? and recently refused hospice care. Currently taking oral chemotherapy pills and caregiver reports increasing weakness, SHOB, & pitting edema.

## 2018-10-17 NOTE — ED Notes (Signed)
Urine and urine culture sent to lab. Patient voided via purewick.

## 2018-10-17 NOTE — ED Notes (Signed)
Rectal temperature 97.7. Applied warm blankets to patient. Occult blood card carried to lab.

## 2018-10-17 NOTE — ED Notes (Signed)
Bed: QM21 Expected date:  Expected time:  Means of arrival:  Comments: 95F-weakness/chemo pt

## 2018-10-17 NOTE — ED Provider Notes (Signed)
Emergency Department Provider Note   I have reviewed the triage vital signs and the nursing notes.   HISTORY  Chief Complaint Weakness   HPI Christina [name] is a 84 y.o. female with a history of ovarian cancer who presents the emergency department today for generalized weakness.  Patient is not able to offer history is all obtained from EMS who obtained from her family.  Is now she is a worsening lower extremity swelling and generalized weakness.  No other HPI elements.  States that she is had worsening weakness over the last few days however she can go up stairs anymore.  This is abnormal for her. No other associated or modifying symptoms.    Past Medical History:  Diagnosis Date  . Cancer ([place]) 08/2018   ovarian    Patient Active Problem List   Diagnosis Date Noted  . Abdominal pain 10/18/2018  . Elevated troponin 10/18/2018  . UTI (urinary tract infection) 10/28/2018  . Acute renal failure superimposed on stage 3 chronic kidney disease ([place]) 10/16/2018  . Hypercalcemia 10/31/2018  . Ovarian cancer, left ([place]) 08/25/2018  . Chronic pain of left knee 03/17/2017  . Chronic pain of right knee 03/17/2017    Past Surgical History:  Procedure Laterality Date  . CHOLECYSTECTOMY      Current Outpatient Rx  . Order #: [id number] Class: Historical Med  . Order #: [id number] Class: No Print  . Order #: [id number] Class: Normal  . Order #: [id number] Class: No Print  . Order #: [id number] Class: Print  . Order #: [id number] Class: Normal  . Order #: [id number] Class: Historical Med  . Order #: [id number] Class: Print    Allergies Patient has no known allergies.  No family history on file.  Social History Social History   Tobacco Use  . Smoking status: Not on file  Substance Use Topics  . Alcohol use: Not on file  . Drug use: Not on file    Review of Systems  All other systems negative except as documented in the HPI. All pertinent positives and negatives as reviewed in the  HPI. ____________________________________________   PHYSICAL EXAM:  VITAL SIGNS: Vitals:   10/26/2018 2042 10/29/2018 2130 10/13/2018 2230 10/23/2018 2330  BP: (!) 159/63 (!) 144/61 (!) 139/55 (!) 141/56  Pulse: (!) 50 (!) 101 (!) 101 (!) 103  Resp: (!) 21 (!) 23 (!) 23 (!) 25  Temp:    97.7 F (36.5 C)  TempSrc:    Rectal  SpO2: 98% 97% 96% 97%     Constitutional: Alert and conversant.  Eyes: Conjunctivae are normal. PERRL. EOMI. Head: Atraumatic. Nose: No congestion/rhinnorhea. Mouth/Throat: Mucous membranes are very dry.  Oropharynx non-erythematous. Neck: No stridor.  No meningeal signs.   Cardiovascular: Normal rate, regular rhythm. Good peripheral circulation. Grossly normal heart sounds.   Respiratory: tachypneic respiratory effort.  No retractions. Lungs CTAB. Gastrointestinal: Soft and nontender. No distention. Foul smelling breath with dark matter in mouth and likely vomitus on shirt/clothes. Musculoskeletal: edema to mid thighs. No gross deformities of extremities. Neurologic:  No gross focal neurologic deficits are appreciated.  Skin:  Skin is warm, dry and intact. No rash noted.  ____________________________________________   LABS (all labs ordered are listed, but only abnormal results are displayed)  Labs Reviewed  BASIC METABOLIC PANEL - Abnormal; Notable for the following components:      Result Value   Chloride 93 (*)    Glucose, Bld 121 (*)    BUN 101 (*)    Creatinine, Ser 2.43 (*)  Calcium 10.9 (*)    GFR calc non Af Amer 18 (*)    GFR calc Af Amer 20 (*)    Anion gap 16 (*)    All other components within normal limits  CBC - Abnormal; Notable for the following components:   WBC 18.1 (*)    RBC 5.35 (*)    Hemoglobin 16.5 (*)    HCT 51.4 (*)    All other components within normal limits  URINALYSIS, ROUTINE W REFLEX MICROSCOPIC - Abnormal; Notable for the following components:   APPearance HAZY (*)    Hgb urine dipstick LARGE (*)    Leukocytes,  UA MODERATE (*)    RBC / HPF >50 (*)    Bacteria, UA RARE (*)    All other components within normal limits  HEPATIC FUNCTION PANEL - Abnormal; Notable for the following components:   Albumin 2.8 (*)    Total Bilirubin 1.7 (*)    Bilirubin, Direct 0.3 (*)    Indirect Bilirubin 1.4 (*)    All other components within normal limits  TROPONIN I - Abnormal; Notable for the following components:   Troponin I 0.05 (*)    All other components within normal limits  BRAIN NATRIURETIC PEPTIDE - Abnormal; Notable for the following components:   B Natriuretic Peptide 151.7 (*)    All other components within normal limits  I-STAT CG4 LACTIC ACID, ED - Abnormal; Notable for the following components:   Lactic Acid, Venous 3.86 (*)    All other components within normal limits  I-STAT CG4 LACTIC ACID, ED - Abnormal; Notable for the following components:   Lactic Acid, Venous 3.35 (*)    All other components within normal limits  URINE CULTURE  OCCULT BLOOD X 1 CARD TO LAB, STOOL   ____________________________________________  EKG   EKG Interpretation  Date/Time:  Tuesday October 17 2018 15:01:54 EST Ventricular Rate:  124 PR Interval:    QRS Duration: 92 QT Interval:  335 QTC Calculation: 417 R Axis:   26 Text Interpretation:  Sinus tachycardia Ventricular bigeminy similar to december 4 Confirmed by Merrily Pew [id number]) on 10/18/2018 12:39:38 AM       ____________________________________________  RADIOLOGY  Dg Chest 2 View  Result Date: 10/13/2018 CLINICAL DATA:  Weakness.  Cancer patient. EXAM: CHEST - 2 VIEW COMPARISON:  10/11/2018 FINDINGS: Improved aeration of the lung bases, now clear. Resolved left effusion. Negative for heart failure. No new area of infiltrate or mass. IMPRESSION: No active cardiopulmonary disease. Electronically Signed   By: Franchot Gallo M.D.   On: 10/20/2018 16:11   Ct Head Wo Contrast  Result Date: 10/31/2018 CLINICAL DATA:  Altered mental status.  Questionable history of ovarian carcinoma EXAM: CT HEAD WITHOUT CONTRAST TECHNIQUE: Contiguous axial images were obtained from the base of the skull through the vertex without intravenous contrast. COMPARISON:  None. FINDINGS: Brain: There is mild diffuse atrophy. There is no intracranial mass, hemorrhage, extra-axial fluid collection, or midline shift. There is mild small vessel disease in the centra semiovale bilaterally. There is focal decreased attenuation to the right of the anterior horn of the right lateral ventricle, a finding which may represent a small recent/acute infarct. This finding is best seen on axial slice 19 series 2, sagittal slice 21 series 6, and coronal slice 25 series 5. No other findings suggesting potential acute infarct evident. Vascular: There is no appreciable hyperdense vessel. There is calcification in each carotid siphon region. Skull: The bony calvarium appears intact. Sinuses/Orbits: There is  mild mucosal thickening in several ethmoid air cells. Other paranasal sinuses are clear. There is leftward deviation of the nasal septum. Orbits appear symmetric bilaterally except for previous cataract removal on the right. Other: Mastoid air cells are clear. IMPRESSION: 1. Decreased attenuation adjacent to the anterior horn the right lateral ventricle. Suspect recent and potentially acute focal infarct in this area of the inferior right frontal lobe. There is mild periventricular small vessel disease. No mass or hemorrhage evident. 2. There are foci of arterial vascular calcification. There is mucosal thickening in several ethmoid air cells. There is nasal septal deviation. Electronically Signed   By: Lowella Grip III M.D.   On: 10/08/2018 16:22   Mr Brain Wo Contrast  Result Date: 10/10/2018 CLINICAL DATA:  Nausea, vomiting and leg swelling. Follow-up suspected infarct. History of ovarian cancer. EXAM: MRI HEAD WITHOUT CONTRAST TECHNIQUE: Multiplanar, multiecho pulse sequences of  the brain and surrounding structures were obtained without intravenous contrast. COMPARISON:  CT HEAD October 17, 2018 FINDINGS: INTRACRANIAL CONTENTS: No reduced diffusion to suggest acute ischemia or hypercellular tumor. No susceptibility artifact to suggest hemorrhage. The ventricles and sulci are normal for patient's age. Patchy supratentorial white matter FLAIR T2 hyperintensities compatible with mild chronic small vessel ischemic changes. Old small LEFT cerebellar infarct. Old LEFT basal ganglia infarct. Minimal RIGHT parietal encephalomalacia. No suspicious parenchymal signal, masses, mass effect. No abnormal extra-axial fluid collections. No extra-axial masses. VASCULAR: Normal major intracranial vascular flow voids present at skull base. SKULL AND UPPER CERVICAL SPINE: No abnormal sellar expansion. Heterogeneous calvarial bone marrow signal without diffusion abnormality to suggest metastasis. Craniocervical junction maintained. SINUSES/ORBITS: The mastoid air-cells and included paranasal sinuses are well-aerated.The included ocular globes and orbital contents are non-suspicious. Status post RIGHT ocular lens implant. Status post bilateral ocular lens implants. OTHER: Patient is edentulous. IMPRESSION: 1. No acute intracranial process. 2. 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 Old small RIGHT parietal/MCA territory infarct versus TBI. 3. Mild chronic small vessel ischemic changes. Electronically Signed   By: Elon Alas M.D.   On: 10/13/2018 19:22    ____________________________________________   PROCEDURES  Procedure(s) performed:   Procedures  CRITICAL CARE Performed by: Merrily Pew Total critical care time: 35 minutes Critical care time was exclusive of separately billable procedures and treating other patients. Critical care was necessary to treat or prevent imminent or life-threatening deterioration. Critical care was time spent personally by me on the  following activities: development of treatment plan with patient and/or surrogate as well as nursing, discussions with consultants, evaluation of patient's response to treatment, examination of patient, obtaining history from patient or surrogate, ordering and performing treatments and interventions, ordering and review of laboratory studies, ordering and review of radiographic studies, pulse oximetry and re-evaluation of patient's condition.  ____________________________________________   INITIAL IMPRESSION / ASSESSMENT AND PLAN / ED COURSE  AMS. Unclear cause. Dehydrated on exam. Possibly   Ultimately found to have a urinary tract infection started on Rocephin.  Also found to have fluid overload and acute kidney injury so fluids given as well.  Discussed with Dr. Blaine Hamper who will admit   Pertinent labs & imaging results that were available during my care of the patient were reviewed by me and considered in my medical decision making (see chart for details).  ____________________________________________  FINAL CLINICAL IMPRESSION(S) / ED DIAGNOSES  Final diagnoses:  Lower urinary tract infectious disease  AKI (acute kidney injury) ([place])     MEDICATIONS GIVEN DURING THIS VISIT:  Medications  lactated ringers bolus 500 mL (0 mLs Intravenous Stopped 10/23/2018 1750)  lactated ringers bolus 1,000 mL (0 mLs Intravenous Stopped 11/02/2018 2044)  cefTRIAXone (ROCEPHIN) 1 g in sodium chloride 0.9 % 100 mL IVPB (0 g Intravenous Stopped 10/18/18 0023)     NEW OUTPATIENT MEDICATIONS STARTED DURING THIS VISIT:  New Prescriptions   No medications on file    Note:  This note was prepared with assistance of Dragon voice recognition software. Occasional wrong-word or sound-a-like substitutions may have occurred due to the inherent limitations of voice recognition software.   Brighton Delio, Corene Cornea, MD 10/18/18 0040

## 2018-10-17 NOTE — ED Notes (Signed)
Patient transported to MRI 

## 2018-10-17 NOTE — ED Notes (Addendum)
I-stat Lactic results were shown to Dr. Dayna Barker. Nurse aware. 3.86

## 2018-10-17 NOTE — ED Notes (Signed)
Attempted in and out cath on patient, unsuccessful. Patient placed on periwick at this time.

## 2018-10-17 NOTE — ED Notes (Signed)
CRITICAL VALUE ALERT  Critical Value:  0.05 Troponin  Date & Time Notied:  10/25/2018 1646  Provider Notified: Dayna Barker, MD

## 2018-10-17 NOTE — H&P (Signed)
History and Physical    Christina [name] VPX:[id number] DOB: Feb 08, 1931 DOA: 10/29/2018  Referring MD/NP/PA:   PCP: Nolene Ebbs, MD   Patient coming from:  The patient is coming from home.  At baseline, pt is independent for most of ADL.        Chief Complaint: Abdominal pain, chest pain, generalized weakness  HPI: Christina [name] is a 84 y.o. female with medical history significant of ovarian cancer, depression, leg edema, who presents with abdominal pain, chest pain  Patient states that she has been having generalized weakness, abdominal pain and chest pain.  Patient is poor historian, history is limited.  She states that she has worsening abdominal pain, which is diffuse, constant, moderate, nonradiating.  It is associated with nausea, vomiting, but no diarrhea.  She also has chest pain, which is located in the front chest, constant, moderate, nonradiating.  It is associated with shortness of breath, but no cough, fever or chills.  Patient has some discomfort on urination but no burning on urination or frequency.  No unilateral weakness. Pt has bilateral leg edema, she was seen in ED on 08/31/2018, and started with Lasix. Per report, pt recently refused hospice care.   ED Course: pt was found to have WBC 18.1, positive urinalysis for UTI (hazy appearance, moderate amount of leukocyte, rare bacteria, WBC 21-50), Ca=10.9, lactic acid 3.35, troponin 0.05, BNP 151.7, worsening renal function, temperature 97.7, heart rate 50-100s, RR 24, oxygen saturation 96% on room air, chest x-ray negative.  CT head showed possible focal infection, but MRI is negative for acute stroke and it showed old infarction.  CT abdomen/pelvis that showed possible peritoneal carcinomatosis.  Patient is admitted to telemetry bed as inpatient.  Review of Systems:   General: no fevers, chills, no body weight gain, has poor appetite, has fatigue HEENT: no blurry vision, hearing changes or sore throat Respiratory: has dyspnea, no  coughing, wheezing CV: has chest pain, no palpitations GI: has nausea, vomiting, abdominal pain, no diarrhea, constipation GU: has dysuria, no burning on urination, increased urinary frequency, hematuria  Ext: has leg edema Neuro: no unilateral weakness, numbness, or tingling, no vision change or hearing loss Skin: no rash, no skin tear. MSK: No muscle spasm, no deformity, no limitation of range of movement in spin Heme: No easy bruising.  Travel history: No recent long distant travel.  Allergy: No Known Allergies  Past Medical History:  Diagnosis Date  . Cancer ([place]) 08/2018   ovarian    Past Surgical History:  Procedure Laterality Date  . CHOLECYSTECTOMY      Social History:  reports that she has never smoked. She has never used smokeless tobacco. She reports that she does not drink alcohol or use drugs.  Family History: pt could not tell clear family medical history.  Prior to Admission medications   Medication Sig Start Date End Date Taking? Authorizing Provider  acetaminophen-codeine (TYLENOL #3) 300-30 MG tablet Take 1 tablet by mouth every 6 (six) hours as needed for pain. 09/30/18   [provider]  aspirin EC 81 MG tablet Take 1 tablet (81 mg total) by mouth daily. 08/25/18   Nicholas Lose, MD  dexamethasone (DECADRON) 1 MG tablet Take 4 tablets (4 mg total) by mouth daily. 10/03/18   Nicholas Lose, MD  diclofenac sodium (VOLTAREN) 1 % GEL Apply 2 g topically 4 (four) times daily. 08/25/18   Nicholas Lose, MD  furosemide (LASIX) 20 MG tablet Take 3 tablets (60 mg total) by mouth daily as needed  for edema. 78/2/95   Delora Fuel, MD  letrozole [facility]) 2.5 MG tablet Take 1 tablet (2.5 mg total) by mouth daily. 08/25/18   Nicholas Lose, MD  mirtazapine (REMERON) 7.5 MG tablet Take 7.5 mg by mouth daily. 10/16/2018   [provider]  ondansetron (ZOFRAN) 4 MG tablet Take 1 tablet (4 mg total) by mouth every 6 (six) hours as needed for nausea. 62/1/30    Delora Fuel, MD    Physical Exam: Vitals:   10/18/18 0230 10/18/18 0330 10/18/18 0400 10/18/18 0430  BP: (!) 125/57 (!) 136/53 (!) 136/58 (!) 141/67  Pulse: (!) 102 (!) 101 (!) 50 (!) 51  Resp: 20 20 20  (!) 23  Temp:      TempSrc:      SpO2: 97% 96% 99% 97%   General: Not in acute distress.  Generally frail HEENT:       Eyes: PERRL, EOMI, no scleral icterus.       ENT: No discharge from the ears and nose, no pharynx injection, no tonsillar enlargement.        Neck: No JVD, no bruit, no mass felt. Heme: No neck lymph node enlargement. Cardiac: S1/S2, RRR, No murmurs, No gallops or rubs. Respiratory: No rales, wheezing, rhonchi or rubs. GI: Soft, nondistended, diffused tenderness, no rebound pain, no organomegaly, BS present. GU: No hematuria Ext: 3+ pitting leg edema bilaterally. 2+DP/PT pulse bilaterally. Musculoskeletal: No joint deformities, No joint redness or warmth, no limitation of ROM in spin. Skin: No rashes.  Neuro: Alert, oriented X3, cranial nerves II-XII grossly intact, moves all extremities normally.   Psych: Patient is not psychotic, no suicidal or hemocidal ideation.  Labs on Admission: I have personally reviewed following labs and imaging studies  CBC: Recent Labs  Lab 10/23/2018 1509  WBC 18.1*  HGB 16.5*  HCT 51.4*  MCV 96.1  PLT 865   Basic Metabolic Panel: Recent Labs  Lab 11/07/2018 1509  NA 135  K 4.0  CL 93*  CO2 26  GLUCOSE 121*  BUN 101*  CREATININE 2.43*  CALCIUM 10.9*   GFR: Estimated Creatinine Clearance: 18.4 mL/min (A) (by C-G formula based on SCr of 2.43 mg/dL (H)). Liver Function Tests: Recent Labs  Lab 11/01/2018 1509  AST 35  ALT 18  ALKPHOS 100  BILITOT 1.7*  PROT 6.9  ALBUMIN 2.8*   No results for input(s): LIPASE, AMYLASE in the last 168 hours. No results for input(s): AMMONIA in the last 168 hours. Coagulation Profile: Recent Labs  Lab 10/18/18 0500  INR 1.43   Cardiac Enzymes: Recent Labs  Lab  11/03/2018 1509  TROPONINI 0.05*   BNP (last 3 results) No results for input(s): PROBNP in the last 8760 hours. HbA1C: No results for input(s): HGBA1C in the last 72 hours. CBG: No results for input(s): GLUCAP in the last 168 hours. Lipid Profile: No results for input(s): CHOL, HDL, LDLCALC, TRIG, CHOLHDL, LDLDIRECT in the last 72 hours. Thyroid Function Tests: No results for input(s): TSH, T4TOTAL, FREET4, T3FREE, THYROIDAB in the last 72 hours. Anemia Panel: No results for input(s): VITAMINB12, FOLATE, FERRITIN, TIBC, IRON, RETICCTPCT in the last 72 hours. Urine analysis:    Component Value Date/Time   COLORURINE YELLOW 10/28/2018 1509   APPEARANCEUR HAZY (A) 10/12/2018 1509   LABSPEC 1.015 10/08/2018 1509   PHURINE 5.0 11/07/2018 1509   GLUCOSEU NEGATIVE 10/09/2018 1509   HGBUR LARGE (A) 10/31/2018 [place] 10/22/2018 [place] 10/31/2018 [place]  NEGATIVE 11/01/2018 1509   NITRITE NEGATIVE 10/27/2018 1509   LEUKOCYTESUR MODERATE (A) 10/16/2018 1509   Sepsis Labs: @LABRCNTIP (procalcitonin:4,lacticidven:4) )No results found for this or any previous visit (from the past 240 hour(s)).   Radiological Exams on Admission: Ct Abdomen Pelvis Wo Contrast  Result Date: 10/18/2018 CLINICAL DATA:  84 y/o F; increasing weakness, nausea, vomiting, abdominal pain, pitting edema, leukocytosis. History of ovarian cancer post chemotherapy. EXAM: CT ABDOMEN AND PELVIS WITHOUT CONTRAST TECHNIQUE: Multidetector CT imaging of the abdomen and pelvis was performed following the standard protocol without IV contrast. COMPARISON:  None. FINDINGS: Lower chest: Small right pleural effusion. Patulous fluid-filled esophagus. Hepatobiliary: Right lobe of liver small calcified granuloma. No additional focal liver lesion identified. Cholecystectomy. Pancreas: Unremarkable. No pancreatic ductal dilatation or surrounding inflammatory changes. Spleen: Normal in size  without focal abnormality. Adrenals/Urinary Tract: Adrenal glands are unremarkable. Multiple renal cysts. Right kidney interpolar 12 mm stone. No hydronephrosis. Normal bladder. Stomach/Bowel: Stomach is within normal limits. Appendix not identified, no pericecal inflammation. No evidence of bowel wall thickening, distention, or inflammatory changes. Vascular/Lymphatic: Retroperitoneal and mesenteric lymphadenopathy. Multiple peritoneal masses within the omentum, mesentery, and lesser sac with the largest lesions as follows: Liver hilum 4.2 x 6.7 cm (series 2, image 23), splenic hilum 4.1 x 4.2 cm (series 2, image 24), right upper quadrant 2.7 x 2.1 cm (series 2, image 26), anterior omentum 2.4 x 4.1 cm (series 2, image 32), right lower quadrant 2.2 x 2.6 cm (series 2, image 54), pouch of Douglass 2.5 x 2.6 cm (series 2, image 66). Additionally, there is a mass implanted within the paraumbilical hernia measuring up to 3.2 cm. Small volume ascites. Abdominal aortic calcific atherosclerosis. Reproductive: Uterus and bilateral adnexa are unremarkable. Other: No abdominal wall hernia or abnormality. No abdominopelvic ascites. Musculoskeletal: No fracture is seen. IMPRESSION: 1. Multiple peritoneal masses, likely peritoneal carcinomatosis, with the largest mass measuring 6.7 cm at the liver hilum. 2. Retroperitoneal and mesenteric lymphadenopathy, probably metastatic. 3. Small right pleural effusion.  Small volume ascites. 4. Patulous fluid-filled esophagus. 5. Right kidney interpolar nonobstructing stone. 6. Aortic Atherosclerosis (ICD10-I70.0). Electronically Signed   By: Kristine Garbe M.D.   On: 10/18/2018 02:03   Dg Chest 2 View  Result Date: 10/28/2018 CLINICAL DATA:  Weakness.  Cancer patient. EXAM: CHEST - 2 VIEW COMPARISON:  10/11/2018 FINDINGS: Improved aeration of the lung bases, now clear. Resolved left effusion. Negative for heart failure. No new area of infiltrate or mass. IMPRESSION: No  active cardiopulmonary disease. Electronically Signed   By: Franchot Gallo M.D.   On: 10/26/2018 16:11   Ct Head Wo Contrast  Result Date: 10/16/2018 CLINICAL DATA:  Altered mental status. Questionable history of ovarian carcinoma EXAM: CT HEAD WITHOUT CONTRAST TECHNIQUE: Contiguous axial images were obtained from the base of the skull through the vertex without intravenous contrast. COMPARISON:  None. FINDINGS: Brain: There is mild diffuse atrophy. There is no intracranial mass, hemorrhage, extra-axial fluid collection, or midline shift. There is mild small vessel disease in the centra semiovale bilaterally. There is focal decreased attenuation to the right of the anterior horn of the right lateral ventricle, a finding which may represent a small recent/acute infarct. This finding is best seen on axial slice 19 series 2, sagittal slice 21 series 6, and coronal slice 25 series 5. No other findings suggesting potential acute infarct evident. Vascular: There is no appreciable hyperdense vessel. There is calcification in each carotid siphon region. Skull: The bony calvarium appears intact. Sinuses/Orbits: There is mild  mucosal thickening in several ethmoid air cells. Other paranasal sinuses are clear. There is leftward deviation of the nasal septum. Orbits appear symmetric bilaterally except for previous cataract removal on the right. Other: Mastoid air cells are clear. IMPRESSION: 1. Decreased attenuation adjacent to the anterior horn the right lateral ventricle. Suspect recent and potentially acute focal infarct in this area of the inferior right frontal lobe. There is mild periventricular small vessel disease. No mass or hemorrhage evident. 2. There are foci of arterial vascular calcification. There is mucosal thickening in several ethmoid air cells. There is nasal septal deviation. Electronically Signed   By: Lowella Grip III M.D.   On: 11/03/2018 16:22   Mr Brain Wo Contrast  Result Date:  10/19/2018 CLINICAL DATA:  Nausea, vomiting and leg swelling. Follow-up suspected infarct. History of ovarian cancer. EXAM: MRI HEAD WITHOUT CONTRAST TECHNIQUE: Multiplanar, multiecho pulse sequences of the brain and surrounding structures were obtained without intravenous contrast. COMPARISON:  CT HEAD October 17, 2018 FINDINGS: INTRACRANIAL CONTENTS: No reduced diffusion to suggest acute ischemia or hypercellular tumor. No susceptibility artifact to suggest hemorrhage. The ventricles and sulci are normal for patient's age. Patchy supratentorial white matter FLAIR T2 hyperintensities compatible with mild chronic small vessel ischemic changes. Old small LEFT cerebellar infarct. Old LEFT basal ganglia infarct. Minimal RIGHT parietal encephalomalacia. No suspicious parenchymal signal, masses, mass effect. No abnormal extra-axial fluid collections. No extra-axial masses. VASCULAR: Normal major intracranial vascular flow voids present at skull base. SKULL AND UPPER CERVICAL SPINE: No abnormal sellar expansion. Heterogeneous calvarial bone marrow signal without diffusion abnormality to suggest metastasis. Craniocervical junction maintained. SINUSES/ORBITS: The mastoid air-cells and included paranasal sinuses are well-aerated.The included ocular globes and orbital contents are non-suspicious. Status post RIGHT ocular lens implant. Status post bilateral ocular lens implants. OTHER: Patient is edentulous. IMPRESSION: 1. No acute intracranial process. 2. 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 Old small RIGHT parietal/MCA territory infarct versus TBI. 3. Mild chronic small vessel ischemic changes. Electronically Signed   By: Elon Alas M.D.   On: 10/29/2018 19:22     EKG: Independently reviewed.  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  Lactic acid is elevated 3.35.  Patient has 3+ leg edema with unclear etiology, will give IV fluid carefully.  -Admit to telemetry bed as inpatient - IV Rocephin -Follow-up blood culture and urine culture -will get Procalcitonin and trend lactic acid levels per sepsis protocol. -IVF: 1.5 L of ringer solution was given in ED, followed by 755 cc/h of NS  Elevated troponin and chest pain: trop 0.05.  May be due to demand ischemia secondary to sepsis. -PRN nitroglycerin, morphine -Aspirin -Start Lipitor -Troponin x3 -Check A1c, FLP -Repeat EKG morning - 2d echo -Inpatient cardiology consult was requested via epic  Abdominal pain due to ovarian cancer, left ([place]) and Carcinomatosis peritonei: Per report, patient recently refused hospice care.  Patient is to follow-up with Dr. Lindi Adie.  Currently is on letrozole -Continue letrozole -Palliative consult -will add dr. Lindi Adie to treatment team  AoCKD-III: Baseline Cre is 1.62 on  10/11/18, pt's Cre is 2.43 and BUn 101 on admission. Likely due to dehydration and continuation of diuretics - IVF as above - Follow up renal function by BMP - Hold lasix  Hypercalcemia: Calcium 10.9.  Likely due to dehydration, but patient has active cancer, which may have contributed partially. -IV fluid as above  Leg edema: Patient has 3+ bilateral leg edema.  Etiology is not clear.  Chest x-ray did not show pulmonary edema.  BNP 159.7, not sure if patient has underlying CHF.  Patient has hypoalbuminemia with albumin 2.8, which likely contributed partially.  Urinalysis negative for protein. -Hold Lasix due to sepsis -Give 1 dose of albumin 100 g x 1 -Follow-up 2D echo which is also for  chest pain  Inpatient status:  # Patient requires inpatient status due to high intensity of service, high risk for further deterioration and high frequency of surveillance required.  I certify that at the point of admission it is my clinical judgment that the patient will require inpatient hospital care spanning beyond 2 midnights from the point of admission.  . This patient has multiple chronic comorbidities including ovarian cancer, peritoneal carcinomatosis, depression, leg edema . Now patient has presenting symptoms include abdominal pain, chest pain, shortness breath. . The worrisome physical exam findings include diffuse abdominal tenderness, 3+ bilateral leg edema . The initial radiographic and laboratory data are worrisome because of leukocytosis, positive urinalysis, elevated lactic acid, worsening renal function, positive troponin. . Current medical needs: please see my assessment and plan . Predictability of an adverse outcome (risk): Patient is generally frail due to multiple comorbidities including active ovarian cancer and peritoneal carcinomatosis.  He has sepsis due to UTI, chest pain with positive troponin.  Patient is still high risk of deteriorating.  Will need to stay in hospital for at least 2 days.       DVT ppx: SQ Heparin    Code Status: Full code Family Communication: None at bed side.       Disposition Plan:  Anticipate discharge back to previous home environment Consults called:  none Admission status:  Inpatient/tele      Date of Service 10/18/2018    Ivor Costa [facility] Pager [phone]  If 7PM-7AM, please contact night-coverage [email or web address] Password [email or web address] 10/18/2018, 6:09 AM

## 2018-10-18 ENCOUNTER — Other Ambulatory Visit: Payer: Self-pay

## 2018-10-18 ENCOUNTER — Encounter (HOSPITAL_COMMUNITY): Payer: Self-pay

## 2018-10-18 ENCOUNTER — Inpatient Hospital Stay (HOSPITAL_COMMUNITY): Payer: Medicare Other

## 2018-10-18 ENCOUNTER — Inpatient Hospital Stay (HOSPITAL_COMMUNITY): Payer: Self-pay

## 2018-10-18 ENCOUNTER — Emergency Department (HOSPITAL_COMMUNITY): Payer: Medicare Other

## 2018-10-18 DIAGNOSIS — A419 Sepsis, unspecified organism: Secondary | ICD-10-CM | POA: Diagnosis present

## 2018-10-18 DIAGNOSIS — N179 Acute kidney failure, unspecified: Secondary | ICD-10-CM | POA: Diagnosis present

## 2018-10-18 DIAGNOSIS — I82403 Acute embolism and thrombosis of unspecified deep veins of lower extremity, bilateral: Secondary | ICD-10-CM | POA: Diagnosis not present

## 2018-10-18 DIAGNOSIS — R109 Unspecified abdominal pain: Secondary | ICD-10-CM | POA: Diagnosis present

## 2018-10-18 DIAGNOSIS — R600 Localized edema: Secondary | ICD-10-CM | POA: Diagnosis present

## 2018-10-18 DIAGNOSIS — C801 Malignant (primary) neoplasm, unspecified: Secondary | ICD-10-CM | POA: Diagnosis not present

## 2018-10-18 DIAGNOSIS — N390 Urinary tract infection, site not specified: Secondary | ICD-10-CM | POA: Insufficient documentation

## 2018-10-18 DIAGNOSIS — R0602 Shortness of breath: Secondary | ICD-10-CM

## 2018-10-18 DIAGNOSIS — I472 Ventricular tachycardia: Secondary | ICD-10-CM | POA: Diagnosis present

## 2018-10-18 DIAGNOSIS — Z7189 Other specified counseling: Secondary | ICD-10-CM | POA: Diagnosis not present

## 2018-10-18 DIAGNOSIS — C786 Secondary malignant neoplasm of retroperitoneum and peritoneum: Secondary | ICD-10-CM | POA: Diagnosis present

## 2018-10-18 DIAGNOSIS — D696 Thrombocytopenia, unspecified: Secondary | ICD-10-CM | POA: Diagnosis not present

## 2018-10-18 DIAGNOSIS — J690 Pneumonitis due to inhalation of food and vomit: Secondary | ICD-10-CM | POA: Diagnosis not present

## 2018-10-18 DIAGNOSIS — I82413 Acute embolism and thrombosis of femoral vein, bilateral: Secondary | ICD-10-CM | POA: Diagnosis present

## 2018-10-18 DIAGNOSIS — C569 Malignant neoplasm of unspecified ovary: Secondary | ICD-10-CM | POA: Diagnosis present

## 2018-10-18 DIAGNOSIS — Z515 Encounter for palliative care: Secondary | ICD-10-CM | POA: Diagnosis not present

## 2018-10-18 DIAGNOSIS — R609 Edema, unspecified: Secondary | ICD-10-CM

## 2018-10-18 DIAGNOSIS — N183 Chronic kidney disease, stage 3 (moderate): Secondary | ICD-10-CM | POA: Diagnosis present

## 2018-10-18 DIAGNOSIS — R591 Generalized enlarged lymph nodes: Secondary | ICD-10-CM | POA: Diagnosis not present

## 2018-10-18 DIAGNOSIS — E860 Dehydration: Secondary | ICD-10-CM | POA: Diagnosis present

## 2018-10-18 DIAGNOSIS — N3000 Acute cystitis without hematuria: Secondary | ICD-10-CM | POA: Diagnosis not present

## 2018-10-18 DIAGNOSIS — R072 Precordial pain: Secondary | ICD-10-CM | POA: Diagnosis not present

## 2018-10-18 DIAGNOSIS — R079 Chest pain, unspecified: Secondary | ICD-10-CM

## 2018-10-18 DIAGNOSIS — F329 Major depressive disorder, single episode, unspecified: Secondary | ICD-10-CM | POA: Diagnosis present

## 2018-10-18 DIAGNOSIS — I82811 Embolism and thrombosis of superficial veins of right lower extremities: Secondary | ICD-10-CM | POA: Diagnosis present

## 2018-10-18 DIAGNOSIS — C562 Malignant neoplasm of left ovary: Secondary | ICD-10-CM | POA: Diagnosis present

## 2018-10-18 DIAGNOSIS — I82429 Acute embolism and thrombosis of unspecified iliac vein: Secondary | ICD-10-CM | POA: Diagnosis present

## 2018-10-18 DIAGNOSIS — D631 Anemia in chronic kidney disease: Secondary | ICD-10-CM | POA: Diagnosis present

## 2018-10-18 DIAGNOSIS — K920 Hematemesis: Secondary | ICD-10-CM | POA: Diagnosis present

## 2018-10-18 DIAGNOSIS — J90 Pleural effusion, not elsewhere classified: Secondary | ICD-10-CM | POA: Diagnosis present

## 2018-10-18 DIAGNOSIS — G9341 Metabolic encephalopathy: Secondary | ICD-10-CM | POA: Diagnosis present

## 2018-10-18 DIAGNOSIS — E870 Hyperosmolality and hypernatremia: Secondary | ICD-10-CM | POA: Diagnosis present

## 2018-10-18 DIAGNOSIS — R112 Nausea with vomiting, unspecified: Secondary | ICD-10-CM | POA: Diagnosis not present

## 2018-10-18 DIAGNOSIS — I248 Other forms of acute ischemic heart disease: Secondary | ICD-10-CM | POA: Diagnosis present

## 2018-10-18 DIAGNOSIS — C7989 Secondary malignant neoplasm of other specified sites: Secondary | ICD-10-CM | POA: Diagnosis present

## 2018-10-18 DIAGNOSIS — Z66 Do not resuscitate: Secondary | ICD-10-CM | POA: Diagnosis present

## 2018-10-18 LAB — LIPID PANEL
Cholesterol: 211 mg/dL — ABNORMAL HIGH (ref 0–200)
HDL: 26 mg/dL — ABNORMAL LOW (ref >40)
LDL CALC: 136 mg/dL — AB (ref 0–99)
Total CHOL/HDL Ratio: 8.1 RATIO
Triglycerides: 245 mg/dL — ABNORMAL HIGH (ref <150)
VLDL: 49 mg/dL — ABNORMAL HIGH (ref 0–40)

## 2018-10-18 LAB — PROCALCITONIN: Procalcitonin: 0.36 ng/mL

## 2018-10-18 LAB — TSH: TSH: 0.254 u[IU]/mL — ABNORMAL LOW (ref 0.350–4.500)

## 2018-10-18 LAB — TROPONIN I
Troponin I: 0.05 ng/mL (ref <0.03)
Troponin I: 0.04 ng/mL (ref <0.03)
Troponin I: 0.04 ng/mL (ref <0.03)

## 2018-10-18 LAB — LACTIC ACID, PLASMA: Lactic Acid, Venous: 2.7 mmol/L (ref 0.5–1.9)

## 2018-10-18 LAB — HEMOGLOBIN A1C
Hgb A1c MFr Bld: 5.1 % (ref 4.8–5.6)
Mean Plasma Glucose: 99.67 mg/dL

## 2018-10-18 LAB — PROTIME-INR
INR: 1.43
Prothrombin Time: 17.3 seconds — ABNORMAL HIGH (ref 11.4–15.2)

## 2018-10-18 LAB — OCCULT BLOOD X 1 CARD TO LAB, STOOL: Fecal Occult Bld: NEGATIVE

## 2018-10-18 LAB — LIPASE, BLOOD: Lipase: 25 U/L (ref 11–51)

## 2018-10-18 LAB — MAGNESIUM: Magnesium: 2.4 mg/dL (ref 1.7–2.4)

## 2018-10-18 LAB — HEPARIN LEVEL (UNFRACTIONATED): HEPARIN UNFRACTIONATED: 0.44 [IU]/mL (ref 0.30–0.70)

## 2018-10-18 LAB — APTT: aPTT: 23 seconds — ABNORMAL LOW (ref 24–36)

## 2018-10-18 MED ORDER — MIRTAZAPINE 15 MG PO TABS
7.5000 mg | ORAL_TABLET | Freq: Every day | ORAL | Status: DC
Start: 2018-10-18 — End: 2018-10-22
  Administered 2018-10-18 – 2018-10-21 (×4): 7.5 mg via ORAL
  Filled 2018-10-18 (×5): qty 1

## 2018-10-18 MED ORDER — DEXAMETHASONE 4 MG PO TABS
4.0000 mg | ORAL_TABLET | Freq: Every day | ORAL | Status: DC
  Administered 2018-10-18 – 2018-10-21 (×4): 4 mg via ORAL
  Filled 2018-10-18 (×5): qty 1

## 2018-10-18 MED ORDER — ATORVASTATIN CALCIUM 40 MG PO TABS
80.0000 mg | ORAL_TABLET | Freq: Every day | ORAL | Status: DC
  Administered 2018-10-18 – 2018-10-20 (×3): 80 mg via ORAL
  Filled 2018-10-18: qty 2
  Filled 2018-10-18 (×2): qty 1
  Filled 2018-10-18: qty 2

## 2018-10-18 MED ORDER — NITROGLYCERIN 0.4 MG SL SUBL: 0.4000 mg | SUBLINGUAL_TABLET | SUBLINGUAL | Status: DC | PRN

## 2018-10-18 MED ORDER — SODIUM CHLORIDE 0.9 % IV SOLN
1.0000 g | INTRAVENOUS | Status: DC
  Administered 2018-10-18 – 2018-10-22 (×5): 1 g via INTRAVENOUS
  Filled 2018-10-18 (×5): qty 1

## 2018-10-18 MED ORDER — HEPARIN (PORCINE) 25000 UT/250ML-% IV SOLN
1150.0000 [IU]/h | INTRAVENOUS | Status: DC
  Administered 2018-10-18 (×2): 1150 [IU]/h via INTRAVENOUS
  Filled 2018-10-18: qty 250

## 2018-10-18 MED ORDER — SODIUM CHLORIDE 0.9 % IV SOLN
INTRAVENOUS | Status: DC
  Administered 2018-10-18 – 2018-10-23 (×9): via INTRAVENOUS

## 2018-10-18 MED ORDER — ALBUMIN HUMAN 25 % IV SOLN
100.0000 g | Freq: Once | INTRAVENOUS | Status: AC
  Administered 2018-10-18: 100 g via INTRAVENOUS
  Filled 2018-10-18 (×2): qty 400

## 2018-10-18 MED ORDER — HEPARIN BOLUS VIA INFUSION
2000.0000 [IU] | Freq: Once | INTRAVENOUS | Status: AC
  Administered 2018-10-18: 2000 [IU] via INTRAVENOUS
  Filled 2018-10-18: qty 2000

## 2018-10-18 MED ORDER — ZOLPIDEM TARTRATE 5 MG PO TABS: 5.0000 mg | ORAL_TABLET | Freq: Every evening | ORAL | Status: DC | PRN

## 2018-10-18 MED ORDER — HEPARIN SODIUM (PORCINE) 5000 UNIT/ML IJ SOLN
5000.0000 [IU] | Freq: Three times a day (TID) | INTRAMUSCULAR | Status: DC
  Administered 2018-10-18: 5000 [IU] via SUBCUTANEOUS
  Filled 2018-10-18 (×2): qty 1

## 2018-10-18 MED ORDER — ACETAMINOPHEN 325 MG PO TABS
650.0000 mg | ORAL_TABLET | ORAL | Status: DC | PRN
  Administered 2018-10-18 – 2018-10-21 (×3): 650 mg via ORAL
  Filled 2018-10-18 (×3): qty 2

## 2018-10-18 MED ORDER — LETROZOLE 2.5 MG PO TABS
2.5000 mg | ORAL_TABLET | Freq: Every day | ORAL | Status: DC
  Administered 2018-10-18 – 2018-10-24 (×6): 2.5 mg via ORAL
  Filled 2018-10-18 (×7): qty 1

## 2018-10-18 MED ORDER — ONDANSETRON HCL 4 MG/2ML IJ SOLN
4.0000 mg | Freq: Four times a day (QID) | INTRAMUSCULAR | Status: DC | PRN
  Administered 2018-10-18 – 2018-10-19 (×3): 4 mg via INTRAVENOUS
  Filled 2018-10-18 (×3): qty 2

## 2018-10-18 MED ORDER — MORPHINE SULFATE (PF) 2 MG/ML IV SOLN
1.0000 mg | INTRAVENOUS | Status: DC | PRN
  Administered 2018-10-19 – 2018-10-23 (×6): 1 mg via INTRAVENOUS
  Filled 2018-10-18 (×6): qty 1

## 2018-10-18 MED ORDER — PHENOL 1.4 % MT LIQD
2.0000 | OROMUCOSAL | Status: DC | PRN
  Filled 2018-10-18: qty 177

## 2018-10-18 MED ORDER — ASPIRIN EC 325 MG PO TBEC
325.0000 mg | DELAYED_RELEASE_TABLET | Freq: Every day | ORAL | Status: DC
  Administered 2018-10-18 – 2018-10-19 (×2): 325 mg via ORAL
  Filled 2018-10-18 (×2): qty 1

## 2018-10-18 NOTE — Progress Notes (Signed)
ANTICOAGULATION CONSULT NOTE - Initial Consult  Pharmacy Consult for heparin Indication: DVT  No Known Allergies  Patient Measurements:   Heparin Dosing Weight: ~ 69 kg  Vital Signs: BP: 135/50 (12/11 1100) Pulse Rate: 47 (12/11 1100)  Labs: Recent Labs    11/04/2018 1509 10/18/18 0500 10/18/18 0820  HGB 16.5*  --   --   HCT 51.4*  --   --   PLT 160  --   --   APTT  --  23*  --   LABPROT  --  17.3*  --   INR  --  1.43  --   CREATININE 2.43*  --   --   TROPONINI 0.05* 0.05* 0.04*    Estimated Creatinine Clearance: 18.4 mL/min (A) (by C-G formula based on SCr of 2.43 mg/dL (H)).   Medical History: Past Medical History:  Diagnosis Date  . Cancer ([place]) 08/2018   ovarian   Assessment: 84 yo F brought to ED by [facility] for weakness, SHOB and pitting edema.  Pharmacy consulted to dose heparin for B DVT.  No anticoagulants PTA. SCr elevated at 2.43. Hg 16.5, pltc 1660.  Baseline INR elevateda t 1.43.  She was given sq heparin 5000 units this morning at 06 am.    Goal of Therapy:  Heparin level 0.3-0.7 units/ml Monitor platelets by anticoagulation protocol: Yes   Plan:  Dc sq heparin  Give 2000 units bolus x 1 Start heparin infusion at 1150 units/hr Check anti-Xa level in 8 hours and daily while on heparin Continue to monitor H&H and platelets   Eudelia Bunch, Pharm.D [phone] 10/18/2018 11:54 AM   Leodis Sias T 10/18/2018,11:46 AM

## 2018-10-18 NOTE — Progress Notes (Signed)
Bilateral lower extremities venous duplex exam completed. Positive Findings consistent with Acute Deep Vein Thrombosis involving the right common femoral vein, right femoral vein, right proximal profunda vein, and right popliteal vein, the left common femoral vein, left femoral vein, and left popliteal vein. Findings consistent with acute superficial vein thrombosis involving the right great saphenous vein.  Exam also attempted to approach bilateral external iliac veins. Left external iliac vein appears thrombosis. Right side unable to approach due to patient's refusal.  Incidental findings: Multiple prominent Hypochoic lesions seen at groin area bilaterally with non-vascularity. Etiology unknown.  Result notified RN.  Please see preliminary notes on CV PROC under chart review.  Tremel Setters H Derek Huneycutt(RDMS RVT) 10/18/18 11:42 AM

## 2018-10-18 NOTE — ED Notes (Signed)
Notified of critical Lactic Acid level of 2.7 by pharmacy.

## 2018-10-18 NOTE — Consult Note (Signed)
Cardiology Consultation:   Patient ID: Christina [name] MRN: [id number]; DOB: 1930-11-12  Admit date: 10/15/2018 Date of Consult: 10/18/2018  Primary Care Provider: Nolene Ebbs, MD Primary Cardiologist: Kirk Ruths, MD new Primary Electrophysiologist:  None    Patient Profile:   Christina [name] is a 84 y.o. female with a hx of ovarian cancer with dx in )ct 2019, depression, leg edema presented to Er yesterday with abd pain, chest pain and weakness who is being seen today for the evaluation of chest pain and elevated troponin at the request of Dr [place] Pink.  History of Present Illness:   Christina [name] with a hx of ovarian cancer, depression, leg edema presented to ER yesterday with abd pain, chest pain and weakness.  She noted she had nausea and vomiting, along with her abd pain.  She also noted midsternal chest pain.  Some SOB.  She has been treated in Oct for lower ext edema with lasix.  Pt has been referred to hospice but refused.   EKG:  The EKG was personally reviewed and demonstrates:  SR with Bigeminy PVCs no ST changes  Telemetry:  Telemetry was personally reviewed and demonstrates:  SR with big. PVCs and freq bursts of NSVT.  Up to 5 beats.     Troponin < 0.03; 0.05; 0.05; 0.04 all flat and with Cr 2.43 on admit up from 1.62.   BNP was 151   Na 135, K+ 4l0 Cr 2.43  Lactic acid 3.86 Hgb 16.5 and Hct 51, WBC 18.1  Lipids TG 245, tchol 211, LDL 136  2 V CXR :  Mild bronchitic changes. Small left pleural effusion. Borderline heart size.  CT head with possible CVA but MRI of head without acute process.  Old infarcts. Mld chronic small vessel ischemic changes.  CT of abd with multiple peritoneal masses likely peritoneal carcinomatosis with the largest 6.7 cm at the liver hilum.  Retroperitoneal and mesenteric lymphadenopathy, probably metastatic. Patulous fluid-filled esophagus. Aortic atherosclerosis.  Rt pl effusion- small.    Currenlty pt resting.  She does not speak [place].     Past Medical History:  Diagnosis Date  . Cancer ([place]) 08/2018   ovarian    Past Surgical History:  Procedure Laterality Date  . CHOLECYSTECTOMY       Home Medications:  Prior to Admission medications   Medication Sig Start Date End Date Taking? Authorizing Provider  acetaminophen-codeine (TYLENOL #3) 300-30 MG tablet Take 1 tablet by mouth every 6 (six) hours as needed for pain. 09/30/18   [provider]  aspirin EC 81 MG tablet Take 1 tablet (81 mg total) by mouth daily. 08/25/18   Nicholas Lose, MD  dexamethasone (DECADRON) 1 MG tablet Take 4 tablets (4 mg total) by mouth daily. 10/03/18   Nicholas Lose, MD  diclofenac sodium (VOLTAREN) 1 % GEL Apply 2 g topically 4 (four) times daily. 08/25/18   Nicholas Lose, MD  furosemide (LASIX) 20 MG tablet Take 3 tablets (60 mg total) by mouth daily as needed for edema. 46/2/70   Delora Fuel, MD  letrozole [facility]) 2.5 MG tablet Take 1 tablet (2.5 mg total) by mouth daily. 08/25/18   Nicholas Lose, MD  mirtazapine (REMERON) 7.5 MG tablet Take 7.5 mg by mouth daily. 10/09/2018   [provider]  ondansetron (ZOFRAN) 4 MG tablet Take 1 tablet (4 mg total) by mouth every 6 (six) hours as needed for nausea. 35/0/09   Delora Fuel, MD    Inpatient Medications: Scheduled Meds: . aspirin EC  325 mg Oral Daily  . atorvastatin  80 mg Oral q1800  . dexamethasone  4 mg Oral Daily  . letrozole  2.5 mg Oral Daily  . mirtazapine  7.5 mg Oral Daily   Continuous Infusions: . sodium chloride 75 mL/hr at 10/18/18 0408  . cefTRIAXone (ROCEPHIN)  IV    . heparin 1,150 Units/hr (10/18/18 1213)   PRN Meds: acetaminophen, morphine injection, nitroGLYCERIN, ondansetron (ZOFRAN) IV, phenol, zolpidem  Allergies:   No Known Allergies  Social History:   Social History   Socioeconomic History  . Marital status: Married    Spouse name: Not on file  . Number of children: Not on file  . Years of education: Not on file  . Highest  education level: Not on file  Occupational History  . Not on file  Social Needs  . Financial resource strain: Not on file  . Food insecurity:    Worry: Not on file    Inability: Not on file  . Transportation needs:    Medical: Not on file    Non-medical: Not on file  Tobacco Use  . Smoking status: Never Smoker  . Smokeless tobacco: Never Used  Substance and Sexual Activity  . Alcohol use: Never    Frequency: Never  . Drug use: Never  . Sexual activity: Not on file  Lifestyle  . Physical activity:    Days per week: Not on file    Minutes per session: Not on file  . Stress: Not on file  Relationships  . Social connections:    Talks on phone: Not on file    Gets together: Not on file    Attends religious service: Not on file    Active member of club or organization: Not on file    Attends meetings of clubs or organizations: Not on file    Relationship status: Not on file  . Intimate partner violence:    Fear of current or ex partner: Not on file    Emotionally abused: Not on file    Physically abused: Not on file    Forced sexual activity: Not on file  Other Topics Concern  . Not on file  Social History Narrative  . Not on file    Family History:   Family History  Problem Relation Age of Onset  . Heart disease Father      ROS:  Please see the history of present illness.  General:no colds or fevers, no weight changes Skin:no rashes or ulcers HEENT:no blurred vision, no congestion CV:see HPI PUL:see HPI GI:no diarrhea constipation or melena, no indigestion, + nausea and vomiting  GU:no hematuria, no dysuria MS:no joint pain, no claudication Neuro:no syncope, no lightheadedness Endo:no diabetes, no thyroid disease  All other ROS reviewed and negative.     Physical Exam/Data:   Vitals:   10/18/18 1100 10/18/18 1130 10/18/18 1215 10/18/18 1230  BP: (!) 135/50 132/64 (!) 129/55 (!) 124/53  Pulse: (!) 47 (!) 48 (!) 33 89  Resp: 18 20 (!) 29 (!) 21  Temp:       TempSrc:      SpO2: 96% 97% 96% 96%    Intake/Output Summary (Last 24 hours) at 10/18/2018 1314 Last data filed at 10/18/2018 0023 Gross per 24 hour  Intake 1550 ml  Output -  Net 1550 ml   There were no vitals filed for this visit. There is no height or weight on file to calculate BMI.  General:  Frail female, in no acute  distress currrently resting in bed HEENT: normal Lymph: no adenopathy Neck: no JVD Endocrine:  No thryomegaly Vascular: No carotid bruits; pedal pulses difficult to palpate with edema  Cardiac:  normal S1, S2; RRR; no murmur, gallup rub or click  Lungs:  clear to auscultation bilaterally, no wheezing, rhonchi or rales  Abd: soft, + tenderness, no hepatomegaly  Ext: 2-3+ edema to hips but greatest edema knees down.  Musculoskeletal:  No deformities, BUE and BLE strength normal and equal Skin: warm and dry  Neuro:  Alert when wakened, no focal abnormalities noted Psych:  Normal to flat affect   Relevant CV Studies: Echo pending.  Laboratory Data:  Chemistry Recent Labs  Lab 11/04/2018 1509  NA 135  K 4.0  CL 93*  CO2 26  GLUCOSE 121*  BUN 101*  CREATININE 2.43*  CALCIUM 10.9*  GFRNONAA 18*  GFRAA 20*  ANIONGAP 16*    Recent Labs  Lab 10/09/2018 1509  PROT 6.9  ALBUMIN 2.8*  AST 35  ALT 18  ALKPHOS 100  BILITOT 1.7*   Hematology Recent Labs  Lab 10/31/2018 1509  WBC 18.1*  RBC 5.35*  HGB 16.5*  HCT 51.4*  MCV 96.1  MCH 30.8  MCHC 32.1  RDW 14.6  PLT 160   Cardiac Enzymes Recent Labs  Lab 10/14/2018 1509 10/18/18 0500 10/18/18 0820 10/18/18 1202  TROPONINI 0.05* 0.05* 0.04* 0.04*   No results for input(s): TROPIPOC in the last 168 hours.  BNP Recent Labs  Lab 11/02/2018 1509  BNP 151.7*    DDimer No results for input(s): DDIMER in the last 168 hours.  Radiology/Studies:  Ct Abdomen Pelvis Wo Contrast  Result Date: 10/18/2018 CLINICAL DATA:  84 y/o F; increasing weakness, nausea, vomiting, abdominal pain, pitting  edema, leukocytosis. History of ovarian cancer post chemotherapy. EXAM: CT ABDOMEN AND PELVIS WITHOUT CONTRAST TECHNIQUE: Multidetector CT imaging of the abdomen and pelvis was performed following the standard protocol without IV contrast. COMPARISON:  None. FINDINGS: Lower chest: Small right pleural effusion. Patulous fluid-filled esophagus. Hepatobiliary: Right lobe of liver small calcified granuloma. No additional focal liver lesion identified. Cholecystectomy. Pancreas: Unremarkable. No pancreatic ductal dilatation or surrounding inflammatory changes. Spleen: Normal in size without focal abnormality. Adrenals/Urinary Tract: Adrenal glands are unremarkable. Multiple renal cysts. Right kidney interpolar 12 mm stone. No hydronephrosis. Normal bladder. Stomach/Bowel: Stomach is within normal limits. Appendix not identified, no pericecal inflammation. No evidence of bowel wall thickening, distention, or inflammatory changes. Vascular/Lymphatic: Retroperitoneal and mesenteric lymphadenopathy. Multiple peritoneal masses within the omentum, mesentery, and lesser sac with the largest lesions as follows: Liver hilum 4.2 x 6.7 cm (series 2, image 23), splenic hilum 4.1 x 4.2 cm (series 2, image 24), right upper quadrant 2.7 x 2.1 cm (series 2, image 26), anterior omentum 2.4 x 4.1 cm (series 2, image 32), right lower quadrant 2.2 x 2.6 cm (series 2, image 54), pouch of Douglass 2.5 x 2.6 cm (series 2, image 66). Additionally, there is a mass implanted within the paraumbilical hernia measuring up to 3.2 cm. Small volume ascites. Abdominal aortic calcific atherosclerosis. Reproductive: Uterus and bilateral adnexa are unremarkable. Other: No abdominal wall hernia or abnormality. No abdominopelvic ascites. Musculoskeletal: No fracture is seen. IMPRESSION: 1. Multiple peritoneal masses, likely peritoneal carcinomatosis, with the largest mass measuring 6.7 cm at the liver hilum. 2. Retroperitoneal and mesenteric lymphadenopathy,  probably metastatic. 3. Small right pleural effusion.  Small volume ascites. 4. Patulous fluid-filled esophagus. 5. Right kidney interpolar nonobstructing stone. 6. Aortic Atherosclerosis (ICD10-I70.0). Electronically  Signed   By: Kristine Garbe M.D.   On: 10/18/2018 02:03   Dg Chest 2 View  Result Date: 10/26/2018 CLINICAL DATA:  Weakness.  Cancer patient. EXAM: CHEST - 2 VIEW COMPARISON:  10/11/2018 FINDINGS: Improved aeration of the lung bases, now clear. Resolved left effusion. Negative for heart failure. No new area of infiltrate or mass. IMPRESSION: No active cardiopulmonary disease. Electronically Signed   By: Franchot Gallo M.D.   On: 10/09/2018 16:11   Ct Head Wo Contrast  Result Date: 10/27/2018 CLINICAL DATA:  Altered mental status. Questionable history of ovarian carcinoma EXAM: CT HEAD WITHOUT CONTRAST TECHNIQUE: Contiguous axial images were obtained from the base of the skull through the vertex without intravenous contrast. COMPARISON:  None. FINDINGS: Brain: There is mild diffuse atrophy. There is no intracranial mass, hemorrhage, extra-axial fluid collection, or midline shift. There is mild small vessel disease in the centra semiovale bilaterally. There is focal decreased attenuation to the right of the anterior horn of the right lateral ventricle, a finding which may represent a small recent/acute infarct. This finding is best seen on axial slice 19 series 2, sagittal slice 21 series 6, and coronal slice 25 series 5. No other findings suggesting potential acute infarct evident. Vascular: There is no appreciable hyperdense vessel. There is calcification in each carotid siphon region. Skull: The bony calvarium appears intact. Sinuses/Orbits: There is mild mucosal thickening in several ethmoid air cells. Other paranasal sinuses are clear. There is leftward deviation of the nasal septum. Orbits appear symmetric bilaterally except for previous cataract removal on the right. Other:  Mastoid air cells are clear. IMPRESSION: 1. Decreased attenuation adjacent to the anterior horn the right lateral ventricle. Suspect recent and potentially acute focal infarct in this area of the inferior right frontal lobe. There is mild periventricular small vessel disease. No mass or hemorrhage evident. 2. There are foci of arterial vascular calcification. There is mucosal thickening in several ethmoid air cells. There is nasal septal deviation. Electronically Signed   By: Lowella Grip III M.D.   On: 10/23/2018 16:22   Mr Brain Wo Contrast  Result Date: 10/19/2018 CLINICAL DATA:  Nausea, vomiting and leg swelling. Follow-up suspected infarct. History of ovarian cancer. EXAM: MRI HEAD WITHOUT CONTRAST TECHNIQUE: Multiplanar, multiecho pulse sequences of the brain and surrounding structures were obtained without intravenous contrast. COMPARISON:  CT HEAD October 17, 2018 FINDINGS: INTRACRANIAL CONTENTS: No reduced diffusion to suggest acute ischemia or hypercellular tumor. No susceptibility artifact to suggest hemorrhage. The ventricles and sulci are normal for patient's age. Patchy supratentorial white matter FLAIR T2 hyperintensities compatible with mild chronic small vessel ischemic changes. Old small LEFT cerebellar infarct. Old LEFT basal ganglia infarct. Minimal RIGHT parietal encephalomalacia. No suspicious parenchymal signal, masses, mass effect. No abnormal extra-axial fluid collections. No extra-axial masses. VASCULAR: Normal major intracranial vascular flow voids present at skull base. SKULL AND UPPER CERVICAL SPINE: No abnormal sellar expansion. Heterogeneous calvarial bone marrow signal without diffusion abnormality to suggest metastasis. Craniocervical junction maintained. SINUSES/ORBITS: The mastoid air-cells and included paranasal sinuses are well-aerated.The included ocular globes and orbital contents are non-suspicious. Status post RIGHT ocular lens implant. Status post bilateral ocular  lens implants. OTHER: Patient is edentulous. IMPRESSION: 1. No acute intracranial process. 2. 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 Old small RIGHT parietal/MCA territory infarct versus TBI. 3. Mild chronic small vessel ischemic changes. Electronically Signed   By: Elon Alas M.D.   On: 11/02/2018 19:22   Vas [place] Lower Extremity  Venous (dvt)  Result Date: 10/18/2018  Lower Venous Study Indications: Pain, Swelling, Edema, SOB, and Chest pain, abdominal pain.  Risk Factors: Cancer Ovarian Cancer. Limitations: Body habitus and Pain,discomfort and severe pitting edema, immobility. Performing Technologist: Rudell Cobb  Examination Guidelines: A complete evaluation includes B-mode imaging, spectral Doppler, color Doppler, and power Doppler as needed of all accessible portions of each vessel. Bilateral testing is considered an integral part of a complete examination. Limited examinations for reoccurring indications may be performed as noted.  Right Venous Findings: +---------+---------------+---------+-----------+---------------+--------------+          CompressibilityPhasicitySpontaneityProperties     Summary        +---------+---------------+---------+-----------+---------------+--------------+ CFV      None           Yes      Yes        partially      Acute                                                      re-cannalized                 +---------+---------------+---------+-----------+---------------+--------------+ SFJ      None                                              Acute          +---------+---------------+---------+-----------+---------------+--------------+ FV Prox  None                               partially      Acute                                                      re-cannalized                 +---------+---------------+---------+-----------+---------------+--------------+ FV Mid   Partial                                            Acute          +---------+---------------+---------+-----------+---------------+--------------+ FV DistalNone                    No                        Acute          +---------+---------------+---------+-----------+---------------+--------------+ PFV      None                                              Acute          +---------+---------------+---------+-----------+---------------+--------------+ POP      None  Acute          +---------+---------------+---------+-----------+---------------+--------------+ PTV                              No                        not well                                                                  visualized                                                                with                                                                      compression                                                               due to severe                                                             pitting edema  +---------+---------------+---------+-----------+---------------+--------------+ PERO                             No                        not well                                                                  visualized                                                                with  compression                                                               due to severe                                                             pitting edema  +---------+---------------+---------+-----------+---------------+--------------+ GSV      None                    No                                       +---------+---------------+---------+-----------+---------------+--------------+ Multiple prominent Hypochoic lesions seen at groin area  with non-vascularity. Etiology unknown.  Right Technical Findings: Patient right external iliac vein area could not fully exam due to patient's discomfort and pain, she refused the probe to touch.  Left Venous Findings: +---------+---------------+---------+-----------+----------+-------------------+          CompressibilityPhasicitySpontaneityPropertiesSummary             +---------+---------------+---------+-----------+----------+-------------------+ CFV      None                    No                   Acute               +---------+---------------+---------+-----------+----------+-------------------+ SFJ      None                                         Acute               +---------+---------------+---------+-----------+----------+-------------------+ FV Prox  None                    No                   Acute               +---------+---------------+---------+-----------+----------+-------------------+ FV Mid   None                    No                   Acute               +---------+---------------+---------+-----------+----------+-------------------+ FV Distal                        No                   poor visualization  with no flow                                                              detected.           +---------+---------------+---------+-----------+----------+-------------------+ PFV                                                   Not visualized      +---------+---------------+---------+-----------+----------+-------------------+ POP      None                    No                   Acute               +---------+---------------+---------+-----------+----------+-------------------+ PTV                                                   not well visualized                                                       with compression                                                           due to severe                                                             pitting edema       +---------+---------------+---------+-----------+----------+-------------------+ PERO                                                  not well visualized                                                       with compression  due to severe                                                             pitting edema       +---------+---------------+---------+-----------+----------+-------------------+ Multiple prominent Hypochoic lesions seen at groin area with non-vascularity. Etiology unknown.  Left Technical Findings: Left external iliac vein thrombosis appears.   Summary: Right: Findings consistent with acute deep vein thrombosis involving the right common femoral vein, right femoral vein, right proximal profunda vein, and right popliteal vein. Findings consistent with acute superficial vein thrombosis involving the right  great saphenous vein. No cystic structure found in the popliteal fossa. Left: Findings consistent with acute deep vein thrombosis involving the left common femoral vein, left femoral vein, and left popliteal vein. No cystic structure found in the popliteal fossa.  *See table(s) above for measurements and observations.    Preliminary     Assessment and Plan:   1. Chest pain in addition to abd pain with metastatic ovarian cancer.  Elevated troponin but flat and new AKI.  Echo is pending but would not do ischemic work up.     On ASA.  Dr. Stanford Breed to see. 2. Big. PVCs, and NSVT will check  Magnesium level.  With PVCs it appears her HR is in the 40s to palpation.  3. Bil DVTs and superficial vein thrombosis on IV heparin most likely due to her cancer ? CTA of chest for PE and further metastasis.  4. HLD would not treat in this pt with very poor prognosis 5. AKI per IM       For questions or updates, please contact [place] Please consult [email or web address] for contact info under     Signed, Cecilie Kicks, NP  10/18/2018 1:14 PM As above, patient seen and examined.  Briefly she is an 84 year old female from [place] (history obtained with assistance of interpreter) with past medical history of metastatic ovarian cancer for evaluation of chest pain and abnormal troponin.  Patient has known metastatic ovarian cancer and was last seen by oncology on October 03, 2018.  She has diffuse abdominal metastatic disease.  At that time oncology recommended hospice care.  Patient presented with lower extremity edema, chest and abdominal pain and urinary tract infection.  She apparently has had abdominal and epigastric pain that is worse with palpation.  There is some dyspnea as well.  Troponin minimally elevated and cardiology asked to evaluate. Lower extremity venous Dopplers show acute bilateral DVT.  Abdominal CT shows multiple peritoneal masses likely peritoneal carcinomatosis, retroperitoneal and mesenteric lymphadenopathy, ascites and right pleural effusion.  BUN is 101 and creatinine is 2.43.  Calcium 10.9.  BNP 628.  Troponin 0 0.05.  White blood cell count 18.1.  ECG shows sinus rhythm with ventricular bigeminy.  1 chest pain-patient symptoms are difficult to discern as she does not speak [place].  However they apparently are reproduced with palpation.  Electrocardiogram shows no acute ST changes but does show PVCs.  Troponin minimally elevated but no clear trend and not consistent with acute coronary syndrome.  Patient has metastatic ovarian cancer and oncology has previously recommended hospice care.  She is not a candidate for further cardiac evaluation.  2 acute renal failure-management per primary care.  3 acute bilateral lower extremity DVT-patient is on anticoagulation and this  will be managed by primary care.  4 metastatic ovarian cancer  Patient is not a  candidate for aggressive cardiac procedures.  Recommend palliative care consult.  Cardiology will sign off.  [facility] will sign off.   Medication Recommendations:  Per primary care Other recommendations (labs, testing, etc):  none Follow up as an outpatient:  None  Kirk Ruths, MD

## 2018-10-18 NOTE — Progress Notes (Signed)
[facility] PROGRESS NOTE  Christina [name] HOZ:[id number] DOB: [birth year] DOA: 10/10/2018  PCP: Nolene Ebbs, MD  Brief History/Interval Summary: 84 y.o. female with medical history significant of ovarian cancer, depression, leg edema, who presented with abdominal pain, chest pain.  Patient speaks limited [place].  She is originally from [place]. Pt has bilateral leg edema, she was seen in ED on 08/31/2018, and started with Lasix. Per report, pt recently refused hospice care. CT head showed possible focal infection, but MRI is negative for acute stroke and it showed old infarction.  CT abdomen/pelvis that showed possible peritoneal carcinomatosis.  Patient is admitted to telemetry bed as inpatient.   Reason for Visit: Abdominal pain due to peritoneal carcinomatosis.  Urinary tract infection.  Acute bilateral lower extremity DVT  Consultants: None  Procedures:   Lower extremity venous Doppler Positive Findings consistent with Acute Deep Vein Thrombosis involving the right common femoral vein, right femoral vein, right proximal profunda vein, and right popliteal vein, the left common femoral vein, left femoral vein, and left popliteal vein. Findings consistent with acute superficial vein thrombosis involving the right great saphenous vein.  Exam also attempted to approach bilateral external iliac veins. Left external iliac vein appears thrombosis. Right side unable to approach due to patient's refusal.  Incidental findings: Multiple prominent Hypochoic lesions seen at groin area bilaterally with non-vascularity. Etiology unknown   Antibiotics: Ceftriaxone  Subjective/Interval History: Patient speaks very limited [place].  Complains of abdominal pain.  ROS: Denies any nausea vomiting.  Objective:  Vital Signs  Vitals:   10/18/18 0800 10/18/18 1000 10/18/18 1100 10/18/18 1130  BP: (!) 137/54 (!) 140/56 (!) 135/50 132/64  Pulse: (!) 52 (!) 45 (!) 47 (!) 48  Resp: (!) 24 15 18  20   Temp:      TempSrc:      SpO2: 96% 96% 96% 97%    Intake/Output Summary (Last 24 hours) at 10/18/2018 1227 Last data filed at 10/18/2018 0023 Gross per 24 hour  Intake 1550 ml  Output -  Net 1550 ml   There were no vitals filed for this visit.  General appearance: alert, cooperative, appears stated age, distracted and no distress Head: Normocephalic, without obvious abnormality, atraumatic Resp: Normal effort at rest.  Diminished air entry at the bases.  Few crackles.  No wheezing or rhonchi. Cardio: regular rate and rhythm, S1, S2 normal, no murmur, click, rub or gallop GI: Abdomen is soft.  Tender diffusely without any rebound rigidity or guarding.  No masses organomegaly.  Bowel sounds present. Extremities: Bilateral lower extremity edema noted about 2+ Pulses: 2+ and symmetric Neurologic: No facial asymmetry.  Moving all her extremities.  Lab Results:  Data Reviewed: I have personally reviewed following labs and imaging studies  CBC: Recent Labs  Lab 11/03/2018 1509  WBC 18.1*  HGB 16.5*  HCT 51.4*  MCV 96.1  PLT 704    Basic Metabolic Panel: Recent Labs  Lab 10/16/2018 1509  NA 135  K 4.0  CL 93*  CO2 26  GLUCOSE 121*  BUN 101*  CREATININE 2.43*  CALCIUM 10.9*    GFR: Estimated Creatinine Clearance: 18.4 mL/min (A) (by C-G formula based on SCr of 2.43 mg/dL (H)).  Liver Function Tests: Recent Labs  Lab 10/16/2018 1509  AST 35  ALT 18  ALKPHOS 100  BILITOT 1.7*  PROT 6.9  ALBUMIN 2.8*    Recent Labs  Lab 10/18/18 0500  LIPASE 25   Coagulation Profile: Recent Labs  Lab 10/18/18 0500  INR 1.43    Cardiac Enzymes: Recent Labs  Lab 10/27/2018 1509 10/18/18 0500 10/18/18 0820  TROPONINI 0.05* 0.05* 0.04*    HbA1C: Recent Labs    10/18/18 0500  HGBA1C 5.1    Lipid Profile: Recent Labs    10/18/18 0500  CHOL 211*  HDL 26*  LDLCALC 136*  TRIG 245*  CHOLHDL 8.1     Recent Results (from the past 240 hour(s))  Culture,  blood (x 2)     Status: None (Preliminary result)   Collection Time: 10/18/18  8:20 AM  Result Value Ref Range Status   Specimen Description   Final    BLOOD LEFT ARM Performed at [facility] [address], [place], [place] [address]    Special Requests   Final    BOTTLES DRAWN AEROBIC AND ANAEROBIC Blood Culture adequate volume Performed at [facility], [facility] [address] [place], [place] [address]    Culture PENDING  Incomplete   Report Status PENDING  Incomplete      Radiology Studies: Ct Abdomen Pelvis Wo Contrast  Result Date: 10/18/2018 CLINICAL DATA:  84 y/o F; increasing weakness, nausea, vomiting, abdominal pain, pitting edema, leukocytosis. History of ovarian cancer post chemotherapy. EXAM: CT ABDOMEN AND PELVIS WITHOUT CONTRAST TECHNIQUE: Multidetector CT imaging of the abdomen and pelvis was performed following the standard protocol without IV contrast. COMPARISON:  None. FINDINGS: Lower chest: Small right pleural effusion. Patulous fluid-filled esophagus. Hepatobiliary: Right lobe of liver small calcified granuloma. No additional focal liver lesion identified. Cholecystectomy. Pancreas: Unremarkable. No pancreatic ductal dilatation or surrounding inflammatory changes. Spleen: Normal in size without focal abnormality. Adrenals/Urinary Tract: Adrenal glands are unremarkable. Multiple renal cysts. Right kidney interpolar 12 mm stone. No hydronephrosis. Normal bladder. Stomach/Bowel: Stomach is within normal limits. Appendix not identified, no pericecal inflammation. No evidence of bowel wall thickening, distention, or inflammatory changes. Vascular/Lymphatic: Retroperitoneal and mesenteric lymphadenopathy. Multiple peritoneal masses within the omentum, mesentery, and lesser sac with the largest lesions as follows: Liver hilum 4.2 x 6.7 cm (series 2, image 23), splenic hilum 4.1 x 4.2 cm (series 2, image 24), right upper quadrant 2.7 x 2.1 cm (series 2, image  26), anterior omentum 2.4 x 4.1 cm (series 2, image 32), right lower quadrant 2.2 x 2.6 cm (series 2, image 54), pouch of Douglass 2.5 x 2.6 cm (series 2, image 66). Additionally, there is a mass implanted within the paraumbilical hernia measuring up to 3.2 cm. Small volume ascites. Abdominal aortic calcific atherosclerosis. Reproductive: Uterus and bilateral adnexa are unremarkable. Other: No abdominal wall hernia or abnormality. No abdominopelvic ascites. Musculoskeletal: No fracture is seen. IMPRESSION: 1. Multiple peritoneal masses, likely peritoneal carcinomatosis, with the largest mass measuring 6.7 cm at the liver hilum. 2. Retroperitoneal and mesenteric lymphadenopathy, probably metastatic. 3. Small right pleural effusion.  Small volume ascites. 4. Patulous fluid-filled esophagus. 5. Right kidney interpolar nonobstructing stone. 6. Aortic Atherosclerosis (ICD10-I70.0). Electronically Signed   By: Kristine Garbe M.D.   On: 10/18/2018 02:03   Dg Chest 2 View  Result Date: 10/26/2018 CLINICAL DATA:  Weakness.  Cancer patient. EXAM: CHEST - 2 VIEW COMPARISON:  10/11/2018 FINDINGS: Improved aeration of the lung bases, now clear. Resolved left effusion. Negative for heart failure. No new area of infiltrate or mass. IMPRESSION: No active cardiopulmonary disease. Electronically Signed   By: Franchot Gallo M.D.   On: 10/27/2018 16:11   Ct Head Wo Contrast  Result Date: 10/26/2018 CLINICAL DATA:  Altered mental status. Questionable history  of ovarian carcinoma EXAM: CT HEAD WITHOUT CONTRAST TECHNIQUE: Contiguous axial images were obtained from the base of the skull through the vertex without intravenous contrast. COMPARISON:  None. FINDINGS: Brain: There is mild diffuse atrophy. There is no intracranial mass, hemorrhage, extra-axial fluid collection, or midline shift. There is mild small vessel disease in the centra semiovale bilaterally. There is focal decreased attenuation to the right of the  anterior horn of the right lateral ventricle, a finding which may represent a small recent/acute infarct. This finding is best seen on axial slice 19 series 2, sagittal slice 21 series 6, and coronal slice 25 series 5. No other findings suggesting potential acute infarct evident. Vascular: There is no appreciable hyperdense vessel. There is calcification in each carotid siphon region. Skull: The bony calvarium appears intact. Sinuses/Orbits: There is mild mucosal thickening in several ethmoid air cells. Other paranasal sinuses are clear. There is leftward deviation of the nasal septum. Orbits appear symmetric bilaterally except for previous cataract removal on the right. Other: Mastoid air cells are clear. IMPRESSION: 1. Decreased attenuation adjacent to the anterior horn the right lateral ventricle. Suspect recent and potentially acute focal infarct in this area of the inferior right frontal lobe. There is mild periventricular small vessel disease. No mass or hemorrhage evident. 2. There are foci of arterial vascular calcification. There is mucosal thickening in several ethmoid air cells. There is nasal septal deviation. Electronically Signed   By: Lowella Grip III M.D.   On: 10/13/2018 16:22   Mr Brain Wo Contrast  Result Date: 10/30/2018 CLINICAL DATA:  Nausea, vomiting and leg swelling. Follow-up suspected infarct. History of ovarian cancer. EXAM: MRI HEAD WITHOUT CONTRAST TECHNIQUE: Multiplanar, multiecho pulse sequences of the brain and surrounding structures were obtained without intravenous contrast. COMPARISON:  CT HEAD October 17, 2018 FINDINGS: INTRACRANIAL CONTENTS: No reduced diffusion to suggest acute ischemia or hypercellular tumor. No susceptibility artifact to suggest hemorrhage. The ventricles and sulci are normal for patient's age. Patchy supratentorial white matter FLAIR T2 hyperintensities compatible with mild chronic small vessel ischemic changes. Old small LEFT cerebellar infarct.  Old LEFT basal ganglia infarct. Minimal RIGHT parietal encephalomalacia. No suspicious parenchymal signal, masses, mass effect. No abnormal extra-axial fluid collections. No extra-axial masses. VASCULAR: Normal major intracranial vascular flow voids present at skull base. SKULL AND UPPER CERVICAL SPINE: No abnormal sellar expansion. Heterogeneous calvarial bone marrow signal without diffusion abnormality to suggest metastasis. Craniocervical junction maintained. SINUSES/ORBITS: The mastoid air-cells and included paranasal sinuses are well-aerated.The included ocular globes and orbital contents are non-suspicious. Status post RIGHT ocular lens implant. Status post bilateral ocular lens implants. OTHER: Patient is edentulous. IMPRESSION: 1. No acute intracranial process. 2. 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 Old small RIGHT parietal/MCA territory infarct versus TBI. 3. Mild chronic small vessel ischemic changes. Electronically Signed   By: Elon Alas M.D.   On: 10/08/2018 19:22   Vas [place] Lower Extremity Venous (dvt)  Result Date: 10/18/2018  Lower Venous Study Indications: Pain, Swelling, Edema, SOB, and Chest pain, abdominal pain.  Risk Factors: Cancer Ovarian Cancer. Limitations: Body habitus and Pain,discomfort and severe pitting edema, immobility. Performing Technologist: Rudell Cobb  Examination Guidelines: A complete evaluation includes B-mode imaging, spectral Doppler, color Doppler, and power Doppler as needed of all accessible portions of each vessel. Bilateral testing is considered an integral part of a complete examination. Limited examinations for reoccurring indications may be performed as noted.  Right Venous Findings: +---------+---------------+---------+-----------+---------------+--------------+  CompressibilityPhasicitySpontaneityProperties     Summary        +---------+---------------+---------+-----------+---------------+--------------+ CFV       None           Yes      Yes        partially      Acute                                                      re-cannalized                 +---------+---------------+---------+-----------+---------------+--------------+ SFJ      None                                              Acute          +---------+---------------+---------+-----------+---------------+--------------+ FV Prox  None                               partially      Acute                                                      re-cannalized                 +---------+---------------+---------+-----------+---------------+--------------+ FV Mid   Partial                                           Acute          +---------+---------------+---------+-----------+---------------+--------------+ FV DistalNone                    No                        Acute          +---------+---------------+---------+-----------+---------------+--------------+ PFV      None                                              Acute          +---------+---------------+---------+-----------+---------------+--------------+ POP      None                                              Acute          +---------+---------------+---------+-----------+---------------+--------------+ PTV                              No                        not well  visualized                                                                with                                                                      compression                                                               due to severe                                                             pitting edema  +---------+---------------+---------+-----------+---------------+--------------+ PERO                             No                        not well                                                                   visualized                                                                with                                                                      compression                                                               due to severe  pitting edema  +---------+---------------+---------+-----------+---------------+--------------+ GSV      None                    No                                       +---------+---------------+---------+-----------+---------------+--------------+ Multiple prominent Hypochoic lesions seen at groin area with non-vascularity. Etiology unknown.  Right Technical Findings: Patient right external iliac vein area could not fully exam due to patient's discomfort and pain, she refused the probe to touch.  Left Venous Findings: +---------+---------------+---------+-----------+----------+-------------------+          CompressibilityPhasicitySpontaneityPropertiesSummary             +---------+---------------+---------+-----------+----------+-------------------+ CFV      None                    No                   Acute               +---------+---------------+---------+-----------+----------+-------------------+ SFJ      None                                         Acute               +---------+---------------+---------+-----------+----------+-------------------+ FV Prox  None                    No                   Acute               +---------+---------------+---------+-----------+----------+-------------------+ FV Mid   None                    No                   Acute               +---------+---------------+---------+-----------+----------+-------------------+ FV Distal                        No                   poor visualization                                                        with no  flow                                                              detected.           +---------+---------------+---------+-----------+----------+-------------------+ PFV  Not visualized      +---------+---------------+---------+-----------+----------+-------------------+ POP      None                    No                   Acute               +---------+---------------+---------+-----------+----------+-------------------+ PTV                                                   not well visualized                                                       with compression                                                          due to severe                                                             pitting edema       +---------+---------------+---------+-----------+----------+-------------------+ PERO                                                  not well visualized                                                       with compression                                                          due to severe                                                             pitting edema       +---------+---------------+---------+-----------+----------+-------------------+ Multiple prominent Hypochoic lesions seen at groin area with non-vascularity. Etiology unknown.  Left Technical Findings: Left external iliac vein thrombosis appears.   Summary: Right: Findings consistent with acute deep vein thrombosis involving the right common femoral vein, right femoral vein, right proximal profunda vein, and right popliteal vein. Findings consistent with acute  superficial vein thrombosis involving the right  great saphenous vein. No cystic structure found in the popliteal fossa. Left: Findings consistent with acute deep vein thrombosis involving the left common femoral vein, left femoral vein, and left  popliteal vein. No cystic structure found in the popliteal fossa.  *See table(s) above for measurements and observations.    Preliminary      Medications:  Scheduled: . aspirin EC  325 mg Oral Daily  . atorvastatin  80 mg Oral q1800  . dexamethasone  4 mg Oral Daily  . letrozole  2.5 mg Oral Daily  . mirtazapine  7.5 mg Oral Daily   Continuous: . sodium chloride 75 mL/hr at 10/18/18 0408  . cefTRIAXone (ROCEPHIN)  IV    . heparin 1,150 Units/hr (10/18/18 1213)   ZJQ:BHALPFXTKWIOX, morphine injection, nitroGLYCERIN, ondansetron (ZOFRAN) IV, zolpidem    Assessment/Plan:  Urinary tract infection with sepsis Patient met criteria for sepsis at admission with leukocytosis tachypnea and elevated lactic acid level.  Patient was started on ceftriaxone.  Follow-up on culture data.  Lactic acid level improved at 2.7.  Procalcitonin 0.36.  Acute renal failure on chronic kidney disease stage III Baseline creatinine around 1.6.  Creatinine elevated at 2.4 at admission.  Patient was given IV fluids.  Lasix on hold.  Monitor urine output.  No hydronephrosis noted on CT scan.  Renal cysts were noted.  Recheck labs tomorrow.  Acute bilateral lower extremity DVT Patient noted to have significant edema of lower extremities.  Doppler study positive for DVT.  Most likely due to her cancer.  Will start IV heparin.  Elevated troponin and chest pain Thought to be secondary to demand ischemia from sepsis.  Troponin levels have been flat.  Aspirin.  Statin.  LDL 136.  Echocardiogram is pending.  Cardiology was consulted.  HbA1c 5.1.  EKG showed ventricular bigeminy.  Patient does not appear to be in any discomfort at this time.  Acute metabolic encephalopathy Most likely due to all of the above.  CT scan did raise concern for stroke.  MRI however did not show any acute stroke.  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  Abdominal pain Most likely due to ovarian cancer and peritoneal carcinomatosis.  There was also  concern for UTI which is being treated.  History of ovarian cancer with peritoneal carcinomatosis Patient is on letrozole which is being continued.  Followed by medical oncology. Hospice was recommended but apparently patient had declined. Palliative medicine was consulted. On dexamethasone.  Hypercalcemia Calcium level noted to be 10.9.  Could be elevated due to dehydration.  Could also be related to malignancy.  Patient hydrated.  Recheck labs tomorrow.  DVT Prophylaxis: IV heparin    Code Status: Full code Family Communication: No family at bedside.  Discussed with her son over the phone.  Patient's son has unrealistic expectations.  He expects her to get cured. Disposition Plan: Management as outlined above.    LOS: 0 days   [facility] Pager [phone] 10/18/2018, 12:27 PM  If 7PM-7AM, please contact night-coverage at [email or web address], password [facility]

## 2018-10-18 NOTE — ED Notes (Signed)
Cardiologist at bedside.  

## 2018-10-18 NOTE — ED Notes (Signed)
Report given to [id number] RN.

## 2018-10-18 NOTE — ED Notes (Signed)
ED TO INPATIENT HANDOFF REPORT  Name/Age/Gender Christina [name] 84 y.o. female  Code Status    Code Status Orders  (From admission, onward)         Start     Ordered   10/18/18 0120  Full code  Continuous     10/18/18 0121        Code Status History    This patient has a current code status but no historical code status.      Home/SNF/Other Home  Chief Complaint Weakness; Cancer pt  Level of Care/Admitting Diagnosis ED Disposition    ED Disposition Condition [facility] Area: [place] [100102]  Level of Care: Telemetry [5]  Admit to tele based on following criteria: Monitor for Ischemic changes  Diagnosis: Chest pain [092330]  Admitting Physician: Ivor Costa [[id number]]  Attending Physician: Ivor Costa [[id number]]  Estimated length of stay: past midnight tomorrow  Certification:: I certify this patient will need inpatient services for at least 2 midnights  PT Class (Do Not Modify): Inpatient [101]  PT Acc Code (Do Not Modify): Private [1]       Medical History Past Medical History:  Diagnosis Date  . Cancer ([place]) 08/2018   ovarian    Allergies No Known Allergies  IV Location/Drains/Wounds Patient Lines/Drains/Airways Status   Active Line/Drains/Airways    Name:   Placement date:   Placement time:   Site:   Days:   Peripheral IV 10/11/2018 Right Forearm   11/06/2018    1510    Forearm   1   Peripheral IV 10/18/18 Left Arm   10/18/18    0822    Arm   less than 1          Labs/Imaging Results for orders placed or performed during the hospital encounter of 11/02/2018 (from the past 48 hour(s))  Basic metabolic panel     Status: Abnormal   Collection Time: 11/07/2018  3:09 PM  Result Value Ref Range   Sodium 135 135 - 145 mmol/L   Potassium 4.0 3.5 - 5.1 mmol/L   Chloride 93 (L) 98 - 111 mmol/L   CO2 26 22 - 32 mmol/L   Glucose, Bld 121 (H) 70 - 99 mg/dL   BUN 101 (H) 8 - 23 mg/dL    Comment: RESULTS CONFIRMED BY MANUAL  DILUTION   Creatinine, Ser 2.43 (H) 0.44 - 1.00 mg/dL   Calcium 10.9 (H) 8.9 - 10.3 mg/dL   GFR calc non Af Amer 18 (L) >60 mL/min   GFR calc Af Amer 20 (L) >60 mL/min   Anion gap 16 (H) 5 - 15    Comment: Performed at [facility], [place] [address], [place], [place] [address]  CBC     Status: Abnormal   Collection Time: 10/23/2018  3:09 PM  Result Value Ref Range   WBC 18.1 (H) 4.0 - 10.5 K/uL   RBC 5.35 (H) 3.87 - 5.11 MIL/uL   Hemoglobin 16.5 (H) 12.0 - 15.0 g/dL   HCT 51.4 (H) 36.0 - 46.0 %   MCV 96.1 80.0 - 100.0 fL   MCH 30.8 26.0 - 34.0 pg   MCHC 32.1 30.0 - 36.0 g/dL   RDW 14.6 11.5 - 15.5 %   Platelets 160 150 - 400 K/uL   nRBC 0.0 0.0 - 0.2 %    Comment: Performed at [facility], [place] [address] [place],  [address]  Urinalysis, Routine w reflex microscopic  Status: Abnormal   Collection Time: 11/05/2018  3:09 PM  Result Value Ref Range   Color, Urine YELLOW YELLOW   APPearance HAZY (A) CLEAR   Specific Gravity, Urine 1.015 1.005 - 1.030   pH 5.0 5.0 - 8.0   Glucose, UA NEGATIVE NEGATIVE mg/dL   Hgb urine dipstick LARGE (A) NEGATIVE   Bilirubin Urine NEGATIVE NEGATIVE   Ketones, ur NEGATIVE NEGATIVE mg/dL   Protein, ur NEGATIVE NEGATIVE mg/dL   Nitrite NEGATIVE NEGATIVE   Leukocytes, UA MODERATE (A) NEGATIVE   RBC / HPF >50 (H) 0 - 5 RBC/hpf   WBC, UA 21-50 0 - 5 WBC/hpf   Bacteria, UA RARE (A) NONE SEEN   Squamous Epithelial / LPF 0-5 0 - 5   Hyaline Casts, UA PRESENT     Comment: Performed at [facility], [place] [address] [place], [place] [address]  Hepatic function panel     Status: Abnormal   Collection Time: 10/14/2018  3:09 PM  Result Value Ref Range   Total Protein 6.9 6.5 - 8.1 g/dL   Albumin 2.8 (L) 3.5 - 5.0 g/dL   AST 35 15 - 41 U/L   ALT 18 0 - 44 U/L   Alkaline Phosphatase 100 38 - 126 U/L   Total Bilirubin 1.7 (H) 0.3 - 1.2 mg/dL   Bilirubin, Direct 0.3 (H) 0.0 - 0.2 mg/dL   Indirect  Bilirubin 1.4 (H) 0.3 - 0.9 mg/dL    Comment: Performed at [facility], [place] [address] [place], [place] [address]  Troponin I - ONCE - STAT     Status: Abnormal   Collection Time: 10/14/2018  3:09 PM  Result Value Ref Range   Troponin I 0.05 (HH) <0.03 ng/mL    Comment: CRITICAL RESULT CALLED TO, READ BACK BY AND VERIFIED WITH: S.HEDGECOCK AT 7510 ON 10/10/2018 BY N.THOMPSON Performed at [facility], [place] [address] [place], [place] [address]   Brain natriuretic peptide     Status: Abnormal   Collection Time: 10/27/2018  3:09 PM  Result Value Ref Range   B Natriuretic Peptide 151.7 (H) 0.0 - 100.0 pg/mL    Comment: Performed at [facility], [place] [address] [place], [place] [address]  I-Stat CG4 Lactic Acid, ED     Status: Abnormal   Collection Time: 10/10/2018  3:14 PM  Result Value Ref Range   Lactic Acid, Venous 3.86 (HH) 0.5 - 1.9 mmol/L   Comment NOTIFIED PHYSICIAN   Occult blood card to lab, stool     Status: None   Collection Time: 10/09/2018  5:23 PM  Result Value Ref Range   Fecal Occult Bld NEGATIVE NEGATIVE    Comment: Performed at [facility], [place] [address], [place],  [address]  I-Stat CG4 Lactic Acid, ED     Status: Abnormal   Collection Time: 11/07/2018  6:08 PM  Result Value Ref Range   Lactic Acid, Venous 3.35 (HH) 0.5 - 1.9 mmol/L   Comment NOTIFIED PHYSICIAN   Hemoglobin A1c     Status: None   Collection Time: 10/18/18  5:00 AM  Result Value Ref Range   Hgb A1c MFr Bld 5.1 4.8 - 5.6 %    Comment: (NOTE) Pre diabetes:          5.7%-6.4% Diabetes:              >6.4% Glycemic control for   <7.0% adults with diabetes    Mean Plasma Glucose  99.67 mg/dL    Comment: Performed at [facility], [place] [address], [place], [place] [address]  Lipid panel     Status: Abnormal   Collection Time: 10/18/18  5:00 AM  Result Value Ref Range   Cholesterol 211 (H) 0 - 200 mg/dL   Triglycerides  245 (H) <150 mg/dL   HDL 26 (L) >40 mg/dL   Total CHOL/HDL Ratio 8.1 RATIO   VLDL 49 (H) 0 - 40 mg/dL   LDL Cholesterol 136 (H) 0 - 99 mg/dL    Comment:        Total Cholesterol/HDL:CHD Risk Coronary Heart Disease Risk Table                     Men   Women  1/2 Average Risk   3.4   3.3  Average Risk       5.0   4.4  2 X Average Risk   9.6   7.1  3 X Average Risk  23.4   11.0        Use the calculated Patient Ratio above and the CHD Risk Table to determine the patient's CHD Risk.        ATP III CLASSIFICATION (LDL):  <100     mg/dL   Optimal  100-129  mg/dL   Near or Above                    Optimal  130-159  mg/dL   Borderline  160-189  mg/dL   High  >190     mg/dL   Very High Performed at [place] [address] [place], [place] [address]   Troponin I - Now Then Q6H     Status: Abnormal   Collection Time: 10/18/18  5:00 AM  Result Value Ref Range   Troponin I 0.05 (HH) <0.03 ng/mL    Comment: CRITICAL VALUE NOTED.  VALUE IS CONSISTENT WITH PREVIOUSLY REPORTED AND CALLED VALUE. Performed at [facility], [place] [address] [place], [place] [address]   Lipase, blood     Status: None   Collection Time: 10/18/18  5:00 AM  Result Value Ref Range   Lipase 25 11 - 51 U/L    Comment: Performed at [facility], [place] [address] [place], [place] [address]  Lactic acid, plasma     Status: Abnormal   Collection Time: 10/18/18  5:00 AM  Result Value Ref Range   Lactic Acid, Venous 2.7 (HH) 0.5 - 1.9 mmol/L    Comment: CRITICAL RESULT CALLED TO, READ BACK BY AND VERIFIED WITH: BRADLEY,A RN AT 4742 10/18/18 BY TIBBITTS,K Performed at [facility], [place] [address] [place], [place] [address]   Procalcitonin     Status: None   Collection Time: 10/18/18  5:00 AM  Result Value Ref Range   Procalcitonin 0.36 ng/mL    Comment:        Interpretation: PCT (Procalcitonin) <= 0.5 ng/mL: Systemic infection  (sepsis) is not likely. Local bacterial infection is possible. (NOTE)       Sepsis PCT Algorithm           Lower Respiratory Tract                                      Infection PCT Algorithm    ----------------------------     ----------------------------  PCT < 0.25 ng/mL                PCT < 0.10 ng/mL         Strongly encourage             Strongly discourage   discontinuation of antibiotics    initiation of antibiotics    ----------------------------     -----------------------------       PCT 0.25 - 0.50 ng/mL            PCT 0.10 - 0.25 ng/mL               OR       >80% decrease in PCT            Discourage initiation of                                            antibiotics      Encourage discontinuation           of antibiotics    ----------------------------     -----------------------------         PCT >= 0.50 ng/mL              PCT 0.26 - 0.50 ng/mL               AND        <80% decrease in PCT             Encourage initiation of                                             antibiotics       Encourage continuation           of antibiotics    ----------------------------     -----------------------------        PCT >= 0.50 ng/mL                  PCT > 0.50 ng/mL               AND         increase in PCT                  Strongly encourage                                      initiation of antibiotics    Strongly encourage escalation           of antibiotics                                     -----------------------------                                           PCT <= 0.25 ng/mL                                                   OR                                        > 80% decrease in PCT                                     Discontinue / Do not initiate                                             antibiotics Performed at [place] [address], [place], [place] [address]   Protime-INR     Status: Abnormal   Collection Time: 10/18/18   5:00 AM  Result Value Ref Range   Prothrombin Time 17.3 (H) 11.4 - 15.2 seconds   INR 1.43     Comment: Performed at [facility], [place] [address] [place], [place] [address]  APTT     Status: Abnormal   Collection Time: 10/18/18  5:00 AM  Result Value Ref Range   aPTT 23 (L) 24 - 36 seconds    Comment: Performed at [facility], [place] [address] [place], [place] [address]  Troponin I - Now Then Q6H     Status: Abnormal   Collection Time: 10/18/18  8:20 AM  Result Value Ref Range   Troponin I 0.04 (HH) <0.03 ng/mL    Comment: CRITICAL VALUE NOTED.  VALUE IS CONSISTENT WITH PREVIOUSLY REPORTED AND CALLED VALUE. Performed at [facility], [place] [address] [place], [place] [address]   Culture, blood (x 2)     Status: None (Preliminary result)   Collection Time: 10/18/18  8:20 AM  Result Value Ref Range   Specimen Description      BLOOD LEFT ARM Performed at [place] [address] [place], [place] [address]    Special Requests      BOTTLES DRAWN AEROBIC AND ANAEROBIC Blood Culture adequate volume Performed at [facility], [place] [address] [place], [place] [address]    Culture PENDING    Report Status PENDING   Troponin I - Now Then Q6H     Status: Abnormal   Collection Time: 10/18/18 12:02 PM  Result Value Ref Range   Troponin I 0.04 (HH) <0.03 ng/mL    Comment: CRITICAL VALUE NOTED.  VALUE IS CONSISTENT WITH PREVIOUSLY REPORTED AND CALLED VALUE. Performed at [facility], [place] [address], [place], [place] [address]    Ct Abdomen Pelvis Wo Contrast  Result Date: 10/18/2018 CLINICAL DATA:  84 y/o F; increasing weakness, nausea, vomiting, abdominal pain, pitting edema, leukocytosis. History of ovarian cancer post chemotherapy. EXAM: CT ABDOMEN AND PELVIS WITHOUT CONTRAST TECHNIQUE: Multidetector CT imaging of the abdomen and pelvis was performed following the standard protocol  without IV contrast. COMPARISON:  None. FINDINGS: Lower chest: Small right pleural effusion. Patulous fluid-filled esophagus. Hepatobiliary: Right lobe of liver small calcified granuloma. No additional focal liver lesion identified. Cholecystectomy. Pancreas: Unremarkable. No pancreatic ductal dilatation or surrounding inflammatory changes. Spleen: Normal in size without focal abnormality. Adrenals/Urinary Tract: Adrenal glands are unremarkable. Multiple renal cysts. Right kidney interpolar 12 mm stone. No hydronephrosis. Normal bladder. Stomach/Bowel: Stomach is within normal limits. Appendix not  identified, no pericecal inflammation. No evidence of bowel wall thickening, distention, or inflammatory changes. Vascular/Lymphatic: Retroperitoneal and mesenteric lymphadenopathy. Multiple peritoneal masses within the omentum, mesentery, and lesser sac with the largest lesions as follows: Liver hilum 4.2 x 6.7 cm (series 2, image 23), splenic hilum 4.1 x 4.2 cm (series 2, image 24), right upper quadrant 2.7 x 2.1 cm (series 2, image 26), anterior omentum 2.4 x 4.1 cm (series 2, image 32), right lower quadrant 2.2 x 2.6 cm (series 2, image 54), pouch of Douglass 2.5 x 2.6 cm (series 2, image 66). Additionally, there is a mass implanted within the paraumbilical hernia measuring up to 3.2 cm. Small volume ascites. Abdominal aortic calcific atherosclerosis. Reproductive: Uterus and bilateral adnexa are unremarkable. Other: No abdominal wall hernia or abnormality. No abdominopelvic ascites. Musculoskeletal: No fracture is seen. IMPRESSION: 1. Multiple peritoneal masses, likely peritoneal carcinomatosis, with the largest mass measuring 6.7 cm at the liver hilum. 2. Retroperitoneal and mesenteric lymphadenopathy, probably metastatic. 3. Small right pleural effusion.  Small volume ascites. 4. Patulous fluid-filled esophagus. 5. Right kidney interpolar nonobstructing stone. 6. Aortic Atherosclerosis (ICD10-I70.0). Electronically  Signed   By: Kristine Garbe M.D.   On: 10/18/2018 02:03   Dg Chest 2 View  Result Date: 10/12/2018 CLINICAL DATA:  Weakness.  Cancer patient. EXAM: CHEST - 2 VIEW COMPARISON:  10/11/2018 FINDINGS: Improved aeration of the lung bases, now clear. Resolved left effusion. Negative for heart failure. No new area of infiltrate or mass. IMPRESSION: No active cardiopulmonary disease. Electronically Signed   By: Franchot Gallo M.D.   On: 10/19/2018 16:11   Ct Head Wo Contrast  Result Date: 11/01/2018 CLINICAL DATA:  Altered mental status. Questionable history of ovarian carcinoma EXAM: CT HEAD WITHOUT CONTRAST TECHNIQUE: Contiguous axial images were obtained from the base of the skull through the vertex without intravenous contrast. COMPARISON:  None. FINDINGS: Brain: There is mild diffuse atrophy. There is no intracranial mass, hemorrhage, extra-axial fluid collection, or midline shift. There is mild small vessel disease in the centra semiovale bilaterally. There is focal decreased attenuation to the right of the anterior horn of the right lateral ventricle, a finding which may represent a small recent/acute infarct. This finding is best seen on axial slice 19 series 2, sagittal slice 21 series 6, and coronal slice 25 series 5. No other findings suggesting potential acute infarct evident. Vascular: There is no appreciable hyperdense vessel. There is calcification in each carotid siphon region. Skull: The bony calvarium appears intact. Sinuses/Orbits: There is mild mucosal thickening in several ethmoid air cells. Other paranasal sinuses are clear. There is leftward deviation of the nasal septum. Orbits appear symmetric bilaterally except for previous cataract removal on the right. Other: Mastoid air cells are clear. IMPRESSION: 1. Decreased attenuation adjacent to the anterior horn the right lateral ventricle. Suspect recent and potentially acute focal infarct in this area of the inferior right frontal  lobe. There is mild periventricular small vessel disease. No mass or hemorrhage evident. 2. There are foci of arterial vascular calcification. There is mucosal thickening in several ethmoid air cells. There is nasal septal deviation. Electronically Signed   By: Lowella Grip III M.D.   On: 10/31/2018 16:22   Mr Brain Wo Contrast  Result Date: 10/31/2018 CLINICAL DATA:  Nausea, vomiting and leg swelling. Follow-up suspected infarct. History of ovarian cancer. EXAM: MRI HEAD WITHOUT CONTRAST TECHNIQUE: Multiplanar, multiecho pulse sequences of the brain and surrounding structures were obtained without intravenous contrast. COMPARISON:  CT HEAD October 17, 2018 FINDINGS: INTRACRANIAL CONTENTS: No reduced diffusion to suggest acute ischemia or hypercellular tumor. No susceptibility artifact to suggest hemorrhage. The ventricles and sulci are normal for patient's age. Patchy supratentorial white matter FLAIR T2 hyperintensities compatible with mild chronic small vessel ischemic changes. Old small LEFT cerebellar infarct. Old LEFT basal ganglia infarct. Minimal RIGHT parietal encephalomalacia. No suspicious parenchymal signal, masses, mass effect. No abnormal extra-axial fluid collections. No extra-axial masses. VASCULAR: Normal major intracranial vascular flow voids present at skull base. SKULL AND UPPER CERVICAL SPINE: No abnormal sellar expansion. Heterogeneous calvarial bone marrow signal without diffusion abnormality to suggest metastasis. Craniocervical junction maintained. SINUSES/ORBITS: The mastoid air-cells and included paranasal sinuses are well-aerated.The included ocular globes and orbital contents are non-suspicious. Status post RIGHT ocular lens implant. Status post bilateral ocular lens implants. OTHER: Patient is edentulous. IMPRESSION: 1. No acute intracranial process. 2. 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 Old small RIGHT parietal/MCA territory infarct versus TBI. 3.  Mild chronic small vessel ischemic changes. Electronically Signed   By: Elon Alas M.D.   On: 10/22/2018 19:22   Vas [place] Lower Extremity Venous (dvt)  Result Date: 10/18/2018  Lower Venous Study Indications: Pain, Swelling, Edema, SOB, and Chest pain, abdominal pain.  Risk Factors: Cancer Ovarian Cancer. Limitations: Body habitus and Pain,discomfort and severe pitting edema, immobility. Performing Technologist: Rudell Cobb  Examination Guidelines: A complete evaluation includes B-mode imaging, spectral Doppler, color Doppler, and power Doppler as needed of all accessible portions of each vessel. Bilateral testing is considered an integral part of a complete examination. Limited examinations for reoccurring indications may be performed as noted.  Right Venous Findings: +---------+---------------+---------+-----------+---------------+--------------+          CompressibilityPhasicitySpontaneityProperties     Summary        +---------+---------------+---------+-----------+---------------+--------------+ CFV      None           Yes      Yes        partially      Acute                                                      re-cannalized                 +---------+---------------+---------+-----------+---------------+--------------+ SFJ      None                                              Acute          +---------+---------------+---------+-----------+---------------+--------------+ FV Prox  None                               partially      Acute                                                      re-cannalized                 +---------+---------------+---------+-----------+---------------+--------------+ FV Mid   Partial  Acute          +---------+---------------+---------+-----------+---------------+--------------+ FV DistalNone                    No                        Acute           +---------+---------------+---------+-----------+---------------+--------------+ PFV      None                                              Acute          +---------+---------------+---------+-----------+---------------+--------------+ POP      None                                              Acute          +---------+---------------+---------+-----------+---------------+--------------+ PTV                              No                        not well                                                                  visualized                                                                with                                                                      compression                                                               due to severe                                                             pitting edema  +---------+---------------+---------+-----------+---------------+--------------+ PERO  No                        not well                                                                  visualized                                                                with                                                                      compression                                                               due to severe                                                             pitting edema  +---------+---------------+---------+-----------+---------------+--------------+ GSV      None                    No                                       +---------+---------------+---------+-----------+---------------+--------------+ Multiple prominent Hypochoic lesions seen at groin area with non-vascularity. Etiology unknown.  Right Technical Findings: Patient right external iliac vein area could not fully exam due to patient's discomfort and pain, she refused the probe to  touch.  Left Venous Findings: +---------+---------------+---------+-----------+----------+-------------------+          CompressibilityPhasicitySpontaneityPropertiesSummary             +---------+---------------+---------+-----------+----------+-------------------+ CFV      None                    No                   Acute               +---------+---------------+---------+-----------+----------+-------------------+ SFJ      None                                         Acute               +---------+---------------+---------+-----------+----------+-------------------+  FV Prox  None                    No                   Acute               +---------+---------------+---------+-----------+----------+-------------------+ FV Mid   None                    No                   Acute               +---------+---------------+---------+-----------+----------+-------------------+ FV Distal                        No                   poor visualization                                                        with no flow                                                              detected.           +---------+---------------+---------+-----------+----------+-------------------+ PFV                                                   Not visualized      +---------+---------------+---------+-----------+----------+-------------------+ POP      None                    No                   Acute               +---------+---------------+---------+-----------+----------+-------------------+ PTV                                                   not well visualized                                                       with compression                                                          due to severe  pitting edema        +---------+---------------+---------+-----------+----------+-------------------+ PERO                                                  not well visualized                                                       with compression                                                          due to severe                                                             pitting edema       +---------+---------------+---------+-----------+----------+-------------------+ Multiple prominent Hypochoic lesions seen at groin area with non-vascularity. Etiology unknown.  Left Technical Findings: Left external iliac vein thrombosis appears.   Summary: Right: Findings consistent with acute deep vein thrombosis involving the right common femoral vein, right femoral vein, right proximal profunda vein, and right popliteal vein. Findings consistent with acute superficial vein thrombosis involving the right  great saphenous vein. No cystic structure found in the popliteal fossa. Left: Findings consistent with acute deep vein thrombosis involving the left common femoral vein, left femoral vein, and left popliteal vein. No cystic structure found in the popliteal fossa.  *See table(s) above for measurements and observations. Electronically signed by Curt Jews MD on 10/18/2018 at 2:03:24 PM.    Final    EKG Interpretation  Date/Time:  Wednesday October 18 2018 01:37:08 EST Ventricular Rate:  107 PR Interval:    QRS Duration: 100 QT Interval:  366 QTC Calculation: 415 R Axis:   24 Text Interpretation:  Sinus tachycardia Ventricular bigeminy No significant change since last tracing Confirmed by Pryor Curia [phone]) on 10/18/2018 2:49:22 AM Also confirmed by Ward, Cyril Mourning [phone]), editor Hattie Perch ([id number])  on 10/18/2018 7:07:52 AM   Pending Labs Unresulted Labs (From admission, onward)    Start     Ordered   10/19/18 0500  CBC  Daily,   R    Comments:  While on heparin drip    10/18/18 1234    10/19/18 0175  Basic metabolic panel  Tomorrow morning,   R     10/18/18 1248   10/18/18 2030  Heparin level (unfractionated)  Once-Timed,   R     10/18/18 1234   10/18/18 1427  Magnesium  Once,   R     10/18/18 1426   10/18/18 1427  TSH  Once,   R     10/18/18 1426   10/18/18 0122  Culture, blood (x 2)  BLOOD CULTURE X 2,   STAT    Comments:  INITIATE ANTIBIOTICS WITHIN 1 HOUR AFTER BLOOD CULTURES DRAWN.  If unable to obtain  blood cultures, call MD immediately regarding antibiotic instructions.    10/18/18 0122   10/22/2018 1522  Urine culture  ONCE - STAT,   STAT     11/05/2018 1521          Vitals/Pain Today's Vitals   10/18/18 1441 10/18/18 1500 10/18/18 1512 10/18/18 1524  BP: (!) 135/53 (!) 125/54    Pulse: 89 89  88  Resp: 17 19  19   Temp:      TempSrc:      SpO2: 93% (!) 89%  92%  PainSc:   Asleep     Isolation Precautions No active isolations  Medications Medications  cefTRIAXone (ROCEPHIN) 1 g in sodium chloride 0.9 % 100 mL IVPB (has no administration in time range)  aspirin EC tablet 325 mg (325 mg Oral Given 10/18/18 1153)  letrozole [facility]) tablet 2.5 mg (2.5 mg Oral Given 10/18/18 1153)  mirtazapine (REMERON) tablet 7.5 mg (7.5 mg Oral Given 10/18/18 1152)  dexamethasone (DECADRON) tablet 4 mg (4 mg Oral Given 10/18/18 1152)  nitroGLYCERIN (NITROSTAT) SL tablet 0.4 mg (has no administration in time range)  morphine 2 MG/ML injection 1 mg (has no administration in time range)  atorvastatin (LIPITOR) tablet 80 mg (80 mg Oral Given 10/18/18 0409)  acetaminophen (TYLENOL) tablet 650 mg (650 mg Oral Given 10/18/18 1249)  ondansetron (ZOFRAN) injection 4 mg (4 mg Intravenous Given 10/18/18 1250)  zolpidem (AMBIEN) tablet 5 mg (has no administration in time range)  0.9 %  sodium chloride infusion ( Intravenous New Bag/Given 10/18/18 0408)  heparin ADULT infusion 100 units/mL (25000 units/255mL sodium chloride 0.45%) (1,150 Units/hr Intravenous New Bag/Given  10/18/18 1213)  phenol (CHLORASEPTIC) mouth spray 2 spray (has no administration in time range)  lactated ringers bolus 500 mL (0 mLs Intravenous Stopped 11/01/2018 1750)  lactated ringers bolus 1,000 mL (0 mLs Intravenous Stopped 10/19/2018 2044)  cefTRIAXone (ROCEPHIN) 1 g in sodium chloride 0.9 % 100 mL IVPB (0 g Intravenous Stopped 10/18/18 0023)  albumin human 25 % solution 100 g (100 g Intravenous New Bag/Given 10/18/18 0902)  heparin bolus via infusion 2,000 Units (2,000 Units Intravenous Bolus from Bag 10/18/18 1214)    Mobility non-ambulatory

## 2018-10-18 NOTE — Progress Notes (Signed)
Per pharmacy-no change in heparin rate.  Labs in am

## 2018-10-18 NOTE — ED Notes (Signed)
Informed by Vascular Tech that pt is positive for bilateral DVT.

## 2018-10-18 NOTE — Progress Notes (Signed)
Pharmacy Note:  14 y/oF on IV heparin for bilateral lower extremity DVT.    First heparin level at 2011 = 0.44 units/mL, therapeutic on heparin infusion at 1150 units/hr  CBC (on 12/10): Hgb 16.5, Pltc 160  No bleeding or complications of therapy noted per nursing   Plan:  Continue IV heparin infusion at 1150 units/hr  Check heparin level in 8 hours to ensure remains within therapeutic range  Daily CBC and heparin level  Monitor closely for s/sx of bleeding   Lindell Spar, PharmD, BCPS Pager: [phone] 10/18/2018 9:01 PM

## 2018-10-18 NOTE — ED Notes (Addendum)
Pt is a difficult stick, unable to obtain a second set of blood cultures. Dr. [place] Pink aware.

## 2018-10-19 ENCOUNTER — Other Ambulatory Visit (HOSPITAL_COMMUNITY): Payer: Self-pay

## 2018-10-19 DIAGNOSIS — C786 Secondary malignant neoplasm of retroperitoneum and peritoneum: Secondary | ICD-10-CM

## 2018-10-19 DIAGNOSIS — Z515 Encounter for palliative care: Secondary | ICD-10-CM

## 2018-10-19 DIAGNOSIS — R112 Nausea with vomiting, unspecified: Secondary | ICD-10-CM

## 2018-10-19 DIAGNOSIS — D696 Thrombocytopenia, unspecified: Secondary | ICD-10-CM

## 2018-10-19 DIAGNOSIS — C801 Malignant (primary) neoplasm, unspecified: Secondary | ICD-10-CM

## 2018-10-19 DIAGNOSIS — Z7189 Other specified counseling: Secondary | ICD-10-CM

## 2018-10-19 LAB — CBC
HCT: 40.5 % (ref 36.0–46.0)
Hemoglobin: 13 g/dL (ref 12.0–15.0)
MCH: 31.3 pg (ref 26.0–34.0)
MCHC: 32.1 g/dL (ref 30.0–36.0)
MCV: 97.4 fL (ref 80.0–100.0)
Platelets: 92 10*3/uL — ABNORMAL LOW (ref 150–400)
RBC: 4.16 MIL/uL (ref 3.87–5.11)
RDW: 15.1 % (ref 11.5–15.5)
WBC: 13.8 10*3/uL — ABNORMAL HIGH (ref 4.0–10.5)
nRBC: 0 % (ref 0.0–0.2)

## 2018-10-19 LAB — BASIC METABOLIC PANEL
Anion gap: 15 (ref 5–15)
BUN: 102 mg/dL — AB (ref 8–23)
CO2: 30 mmol/L (ref 22–32)
Calcium: 10.3 mg/dL (ref 8.9–10.3)
Chloride: 98 mmol/L (ref 98–111)
Creatinine, Ser: 1.89 mg/dL — ABNORMAL HIGH (ref 0.44–1.00)
GFR calc Af Amer: 28 mL/min — ABNORMAL LOW (ref >60)
GFR calc non Af Amer: 24 mL/min — ABNORMAL LOW (ref >60)
GLUCOSE: 91 mg/dL (ref 70–99)
Potassium: 3.5 mmol/L (ref 3.5–5.1)
Sodium: 143 mmol/L (ref 135–145)

## 2018-10-19 LAB — URINE CULTURE: CULTURE: NO GROWTH

## 2018-10-19 LAB — HEPARIN LEVEL (UNFRACTIONATED): Heparin Unfractionated: 0.52 IU/mL (ref 0.30–0.70)

## 2018-10-19 MED ORDER — BISACODYL 10 MG RE SUPP
10.0000 mg | Freq: Every day | RECTAL | Status: DC | PRN
  Administered 2018-10-19: 10 mg via RECTAL
  Filled 2018-10-19: qty 1

## 2018-10-19 MED ORDER — POTASSIUM CHLORIDE CRYS ER 20 MEQ PO TBCR
20.0000 meq | EXTENDED_RELEASE_TABLET | Freq: Once | ORAL | Status: DC
  Filled 2018-10-19: qty 1

## 2018-10-19 MED ORDER — APIXABAN 5 MG PO TABS: 5.0000 mg | ORAL_TABLET | Freq: Two times a day (BID) | ORAL | Status: DC

## 2018-10-19 MED ORDER — POTASSIUM CHLORIDE 10 MEQ/100ML IV SOLN
10.0000 meq | INTRAVENOUS | Status: AC
  Administered 2018-10-19 (×4): 10 meq via INTRAVENOUS
  Filled 2018-10-19 (×4): qty 100

## 2018-10-19 MED ORDER — APIXABAN 5 MG PO TABS
10.0000 mg | ORAL_TABLET | Freq: Two times a day (BID) | ORAL | Status: DC
  Administered 2018-10-19 (×2): 10 mg via ORAL
  Filled 2018-10-19 (×2): qty 2

## 2018-10-19 MED ORDER — LIP MEDEX EX OINT
TOPICAL_OINTMENT | CUTANEOUS | Status: AC
  Administered 2018-10-19: 1
  Filled 2018-10-19: qty 7

## 2018-10-19 MED ORDER — PROMETHAZINE HCL 25 MG/ML IJ SOLN
12.5000 mg | Freq: Once | INTRAMUSCULAR | Status: AC
  Administered 2018-10-19: 12.5 mg via INTRAVENOUS
  Filled 2018-10-19: qty 1

## 2018-10-19 MED ORDER — METOCLOPRAMIDE HCL 5 MG/ML IJ SOLN
10.0000 mg | Freq: Three times a day (TID) | INTRAMUSCULAR | Status: DC | PRN
  Administered 2018-10-19 – 2018-10-22 (×4): 10 mg via INTRAVENOUS
  Filled 2018-10-19 (×4): qty 2

## 2018-10-19 NOTE — Progress Notes (Addendum)
ANTICOAGULATION CONSULT NOTE  Pharmacy Consult for heparin Indication: DVT  No Known Allergies  Patient Measurements: Height: 5\' 5"  (165.1 cm) Weight: 180 lb 1.6 oz (81.7 kg) IBW/kg (Calculated) : 57 Heparin Dosing Weight: ~ 74 kg  Vital Signs: Temp: 97.4 F (36.3 C) (12/12 0341) Temp Source: Oral (12/12 0341) BP: 125/55 (12/12 0341) Pulse Rate: 78 (12/12 0341)  Labs: Recent Labs    10/29/2018 1509 10/18/18 0500 10/18/18 0820 10/18/18 1202 10/18/18 2011 10/19/18 0454  HGB 16.5*  --   --   --   --  13.0  HCT 51.4*  --   --   --   --  40.5  PLT 160  --   --   --   --  92*  APTT  --  23*  --   --   --   --   LABPROT  --  17.3*  --   --   --   --   INR  --  1.43  --   --   --   --   HEPARINUNFRC  --   --   --   --  0.44 0.52  CREATININE 2.43*  --   --   --   --  1.89*  TROPONINI 0.05* 0.05* 0.04* 0.04*  --   --     Estimated Creatinine Clearance: 23 mL/min (A) (by C-G formula based on SCr of 1.89 mg/dL (H)).   Medical History: Past Medical History:  Diagnosis Date  . Cancer ([place]) 08/2018   ovarian   Assessment: 84 yo F brought to ED with complaints of abdominal pain, chest pain, and generalized weakness. Pt has ovarian cancer and peritoneal carcinomatosis. She was diagnosed with bilateral DVTs and started on heparin IV infusion on 12/11.  Baseline labs: -Hgb 16.5 -Plt 160  10/19/18  Confirmatory heparin Level = 0.52 with heparin @ 1150 units/hr this morning  Hgb 13 - WNL but has decreased   Plt 92 - low and decreased from admission  No issues with infusion. No signs/symptoms of bleeding per RN.  Paged MD regarding thrombocytopenia and Hgb decrease. Pt currently on heparin IV infusion and ASA 325 mg PO daily.  Goal of Therapy:  Heparin level 0.3-0.7 units/ml Monitor platelets by anticoagulation protocol: Yes   Plan:   Per MD, hold heparin drip at this time. Order discontinued.  Follow for anticoagulation plan  CBC daily   Lenis Noon,  PharmD Clinical Pharmacist 10/19/2018,8:17 AM   Addendum: Heparin infusion discontinued this morning per MD. MD discussed anticoagulation with oncologist,  Dr. Lindi Adie, and pharmacy consulted to dose apixaban for treatment of DVT.  SCr 1.89 has improved and is slightly elevated from baseline of ~1.6.  CrCl = 28 mL/min   Apixaban 10 mg PO BID x 7 days followed by apixaban 5 mg PO BID  Patient will need discharge education and manufacturer coupon  CBC with AM labs tomorrow  Closely monitor for sign/symptoms of bleeding or thrombosis.   Lenis Noon, PharmD Clinical Pharmacist 10/19/18 11:28 AM

## 2018-10-19 NOTE — Progress Notes (Signed)
ANTICOAGULATION CONSULT NOTE  Pharmacy Consult for heparin Indication: DVT  No Known Allergies  Patient Measurements: Height: 5\' 5"  (165.1 cm) Weight: 180 lb 1.6 oz (81.7 kg) IBW/kg (Calculated) : 57 Heparin Dosing Weight: ~ 69 kg  Vital Signs: Temp: 97.4 F (36.3 C) (12/12 0341) Temp Source: Oral (12/12 0341) BP: 125/55 (12/12 0341) Pulse Rate: 78 (12/12 0341)  Labs: Recent Labs    10/20/2018 1509 10/18/18 0500 10/18/18 0820 10/18/18 1202 10/18/18 2011 10/19/18 0454  HGB 16.5*  --   --   --   --   --   HCT 51.4*  --   --   --   --   --   PLT 160  --   --   --   --   --   APTT  --  23*  --   --   --   --   LABPROT  --  17.3*  --   --   --   --   INR  --  1.43  --   --   --   --   HEPARINUNFRC  --   --   --   --  0.44 0.52  CREATININE 2.43*  --   --   --   --   --   TROPONINI 0.05* 0.05* 0.04* 0.04*  --   --     Estimated Creatinine Clearance: 17.9 mL/min (A) (by C-G formula based on SCr of 2.43 mg/dL (H)).   Medical History: Past Medical History:  Diagnosis Date  . Cancer ([place]) 08/2018   ovarian   Assessment: 84 yo F brought to ED by [facility] for weakness, SHOB and pitting edema.  Pharmacy consulted to dose heparin for B DVT.  No anticoagulants PTA.  10/19/18  Heparin Level = 0.52 with heparin @ 1150 units/hr (2nd consecutive therapeutic level on current rate)  No complications of therapy noted  Goal of Therapy:  Heparin level 0.3-0.7 units/ml Monitor platelets by anticoagulation protocol: Yes   Plan:  Continue IV heparin @ 1150 units/hr Follow heparin level and CBC daily   Shamela Haydon Trefz 10/19/2018,5:26 AM

## 2018-10-19 NOTE — Consult Note (Signed)
Consultation Note Date: 10/19/2018   Patient Name: Christina [name]  DOB: [birth year]  MRN: [id number]  Age / Sex: 84 y.o., female  PCP: Nolene Ebbs, MD Referring Physician: Bonnielee Haff, MD  Reason for Consultation: Establishing goals of care  HPI/Patient Profile: 84 y.o. female  with past medical history of ovarian cancer (follows with Dr. Lindi Adie; recently restarted letrozole after refusing hospice), CKD3, depression, leg edema admitted on 10/23/2018 with abd pain, chest pain, and generalized weakness. 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 CT abd shows new peritoneal carcinomatosis and cancer progression. From [place] and speaks limited [place]. Husband is deceased and son with his own health issues.   Clinical Assessment and Goals of Care: I met today with Ms. Bassinger along with her son at bedside. Her son speaks good [place] but with heavy accent. I did have audible [place] language interpretor available in conversation but son continued to translate, have side conversations, and monopolize the conversation over his mother which greatly hindered the progress of our meeting. Ms. Joo was also is nauseated and miserable during our conversation with no relief from Zofran provided.   I attempted to explain to Ms. Gunnells and son that her cancer has progressed and that this holds a very poor prognosis. I explained that oncology believes that her cancer will continue to worsen and progress no matter what interventions we provide. I explained that this is my main concern is that his mother is feeling worse and miserable largely d/t progression of her cancer in which we cannot cure and make better. Son does not believe me and tells me that "cancer doctor" says there is IV medication she can get for her cancer and this is what she needs to "cure her." I  explained that this medication would not cure her but at the time this was thought this could potentially slow the cancer but that now we know that her cancer is much worse and this is unlikely to benefit her now. Again he believes that there is a medication that she can have to "make her better" and believes that she is sick from the letrozole. He also says that he is most worried about her blood clots because he father died from a blood clot. I attempted to explain that the blood clots are likely a complication of worsening cancer but he focuses on what we are doing to treat her blood clots. He has very poor understanding.   We left the meeting recognizing that son likely needs to speak directly with Dr. Lindi Adie, will continue to work on treating her blood clots and nausea, and we will meet again and talk more tomorrow afternoon.   Primary Decision Maker PATIENT    SUMMARY OF RECOMMENDATIONS   We left the meeting recognizing that son likely needs to speak directly with Dr. Lindi Adie, will continue to work on treating her blood clots and nausea, and we will meet again and talk more tomorrow afternoon.  Code Status/Advance  Care Planning:  Full code - will need to discuss   Symptom Management:   Nausea: Zofran prn with no relief. Reglan q8h prn added. Dulcolax supp daily prn with unknown LBM. Abd soft with +BS.   Palliative Prophylaxis:   Aspiration, Bowel Regimen, Delirium Protocol and Frequent Pain Assessment  Psycho-social/Spiritual:   Desire for further Chaplaincy support:yes  Additional Recommendations: Caregiving  Support/Resources, Education on Hospice and Grief/Bereavement Support  Prognosis:   Prognosis extremely poor. High risk for continued acute decompensation. Eligible for hospice < 6 months and likely becoming eligible for hospice facility < 2 weeks unless she begins to improve.   Discharge Planning: To Be Determined      Primary Diagnoses: Present on Admission: .  Ovarian cancer, left ([place]) . Elevated troponin . UTI (urinary tract infection) . Acute renal failure superimposed on stage 3 chronic kidney disease ([place]) . Hypercalcemia . Abdominal pain . Chest pain . Carcinomatosis peritonei ([place]) . Leg edema . Sepsis ([place])   I have reviewed the medical record, interviewed the patient and family, and examined the patient. The following aspects are pertinent.  Past Medical History:  Diagnosis Date  . Cancer ([place]) 08/2018   ovarian   Social History   Socioeconomic History  . Marital status: Married    Spouse name: Not on file  . Number of children: Not on file  . Years of education: Not on file  . Highest education level: Not on file  Occupational History  . Not on file  Social Needs  . Financial resource strain: Not on file  . Food insecurity:    Worry: Not on file    Inability: Not on file  . Transportation needs:    Medical: Not on file    Non-medical: Not on file  Tobacco Use  . Smoking status: Never Smoker  . Smokeless tobacco: Never Used  Substance and Sexual Activity  . Alcohol use: Never    Frequency: Never  . Drug use: Never  . Sexual activity: Not on file  Lifestyle  . Physical activity:    Days per week: Not on file    Minutes per session: Not on file  . Stress: Not on file  Relationships  . Social connections:    Talks on phone: Not on file    Gets together: Not on file    Attends religious service: Not on file    Active member of club or organization: Not on file    Attends meetings of clubs or organizations: Not on file    Relationship status: Not on file  Other Topics Concern  . Not on file  Social History Narrative  . Not on file   Family History  Problem Relation Age of Onset  . Heart disease Father    Scheduled Meds: . aspirin EC  325 mg Oral Daily  . atorvastatin  80 mg Oral q1800  . dexamethasone  4 mg Oral Daily  . letrozole  2.5 mg Oral Daily  . mirtazapine  7.5 mg Oral Daily   Continuous  Infusions: . sodium chloride 75 mL/hr at 10/18/18 1729  . cefTRIAXone (ROCEPHIN)  IV 1 g (10/18/18 2234)   PRN Meds:.acetaminophen, morphine injection, nitroGLYCERIN, ondansetron (ZOFRAN) IV, phenol, zolpidem No Known Allergies Review of Systems  Constitutional: Positive for activity change, appetite change and fatigue.  Gastrointestinal: Positive for nausea and vomiting.  Neurological: Positive for weakness.    Physical Exam Vitals signs and nursing note reviewed.  Cardiovascular:     Rate  and Rhythm: Normal rate.  Pulmonary:     Effort: Pulmonary effort is normal. No tachypnea, accessory muscle usage or respiratory distress.  Abdominal:     General: Bowel sounds are normal. There is no distension.     Tenderness: There is no abdominal tenderness.  Neurological:     Mental Status: She is alert.     Comments: Language barrier and symptom burden unable to fully assess orientation and understanding     Vital Signs: BP (!) 125/55 (BP Location: Left Arm)   Pulse 78   Temp (!) 97.4 F (36.3 C) (Oral)   Resp 20   Ht 5' 5"  (1.651 m)   Wt 81.7 kg   SpO2 92%   BMI 29.97 kg/m  Pain Scale: 0-10   Pain Score: 0-No pain   SpO2: SpO2: 92 % O2 Device:SpO2: 92 % O2 Flow Rate: .   IO: Intake/output summary: No intake or output data in the 24 hours ending 10/19/18 1126  LBM: Last BM Date: (unknown) Baseline Weight: Weight: 81.7 kg Most recent weight: Weight: 81.7 kg     Palliative Assessment/Data: 30%     Time In/Out: 1510-1610, 1700-1715 Time Total: 75 min Greater than 50%  of this time was spent counseling and coordinating care related to the above assessment and plan.  Signed by: Vinie Sill, NP Palliative Medicine Team Pager # [phone] (M-F 8a-5p) Team Phone # [phone] (Nights/Weekends)

## 2018-10-19 NOTE — Progress Notes (Signed)
[facility] PROGRESS NOTE  Iverna Hammac KWI:[id number] DOB: [birth year] DOA: 10/18/2018  PCP: Nolene Ebbs, MD  Brief History/Interval Summary: 84 y.o. female with medical history significant of ovarian cancer, depression, leg edema, who presented with abdominal pain, chest pain.  Patient speaks limited [place].  She is originally from [place]. Pt has bilateral leg edema, she was seen in ED on 08/31/2018, and started with Lasix. Per report, pt recently refused hospice care. CT head showed possible focal infection, but MRI is negative for acute stroke and it showed old infarction.  CT abdomen/pelvis that showed possible peritoneal carcinomatosis.  Patient is admitted to telemetry bed as inpatient.   Reason for Visit: Abdominal pain due to peritoneal carcinomatosis.  Urinary tract infection.  Acute bilateral lower extremity DVT  Consultants: None  Procedures:   Lower extremity venous Doppler Positive Findings consistent with Acute Deep Vein Thrombosis involving the right common femoral vein, right femoral vein, right proximal profunda vein, and right popliteal vein, the left common femoral vein, left femoral vein, and left popliteal vein. Findings consistent with acute superficial vein thrombosis involving the right great saphenous vein.  Exam also attempted to approach bilateral external iliac veins. Left external iliac vein appears thrombosis. Right side unable to approach due to patient's refusal.  Incidental findings: Multiple prominent Hypochoic lesions seen at groin area bilaterally with non-vascularity. Etiology unknown   Antibiotics: Ceftriaxone  Subjective/Interval History: Patient speaks very limited [place].  Noted to be distracted this morning.  Objective:  Vital Signs  Vitals:   10/18/18 1722 10/18/18 2040 10/19/18 0341 10/19/18 1232  BP: (!) 138/57 131/68 (!) 125/55 132/73  Pulse: 87 77 78 74  Resp:  (!) 21 20   Temp: 97.7 F (36.5 C) 97.8 F (36.6 C) (!)  97.4 F (36.3 C) 98.3 F (36.8 C)  TempSrc: Oral  Oral Oral  SpO2: 92% 100% 92% 98%  Weight:      Height:       No intake or output data in the 24 hours ending 10/19/18 1245 Filed Weights   10/18/18 1714  Weight: 81.7 kg    General appearance: Awake alert.  Distracted.  No distress Resp: Normal effort at rest.  Diminished air entry at the bases.  Few crackles.  No wheezing.   Cardio: S1-S2 is normal regular.  No S3-S4.  No rubs murmurs or bruit  GI: Abdomen remains soft.  Nondistended.  Tenderness noted diffusely without any rebound rigidity or guarding.  No masses organomegaly.  Bowel sounds present.  Extremities: Bilateral lower extremity edema noted about 2+ Pulses: 2+ and symmetric Neurologic: No obvious focal neurological deficits noted.  Moving all her extremities.  Lab Results:  Data Reviewed: I have personally reviewed following labs and imaging studies  CBC: Recent Labs  Lab 10/10/2018 1509 10/19/18 0454  WBC 18.1* 13.8*  HGB 16.5* 13.0  HCT 51.4* 40.5  MCV 96.1 97.4  PLT 160 92*    Basic Metabolic Panel: Recent Labs  Lab 11/04/2018 1509 10/18/18 1613 10/19/18 0454  NA 135  --  143  K 4.0  --  3.5  CL 93*  --  98  CO2 26  --  30  GLUCOSE 121*  --  91  BUN 101*  --  102*  CREATININE 2.43*  --  1.89*  CALCIUM 10.9*  --  10.3  MG  --  2.4  --     GFR: Estimated Creatinine Clearance: 23 mL/min (A) (by C-G formula based on SCr of 1.89 mg/dL (  H)).  Liver Function Tests: Recent Labs  Lab 10/18/2018 1509  AST 35  ALT 18  ALKPHOS 100  BILITOT 1.7*  PROT 6.9  ALBUMIN 2.8*    Recent Labs  Lab 10/18/18 0500  LIPASE 25   Coagulation Profile: Recent Labs  Lab 10/18/18 0500  INR 1.43    Cardiac Enzymes: Recent Labs  Lab 10/26/2018 1509 10/18/18 0500 10/18/18 0820 10/18/18 1202  TROPONINI 0.05* 0.05* 0.04* 0.04*    HbA1C: Recent Labs    10/18/18 0500  HGBA1C 5.1    Lipid Profile: Recent Labs    10/18/18 0500  CHOL 211*  HDL 26*    LDLCALC 136*  TRIG 245*  CHOLHDL 8.1     Recent Results (from the past 240 hour(s))  Urine culture     Status: None   Collection Time: 10/11/2018  3:22 PM  Result Value Ref Range Status   Specimen Description   Final    URINE, RANDOM Performed at [facility], [place] [address], [place], [place] [address]    Special Requests   Final    NONE Performed at [facility], [place] [address], [place], [place] [address]    Culture   Final    NO GROWTH Performed at [facility], [place] [address] [place], [place] [address]    Report Status 10/19/2018 FINAL  Final  Culture, blood (x 2)     Status: None (Preliminary result)   Collection Time: 10/18/18  8:20 AM  Result Value Ref Range Status   Specimen Description   Final    BLOOD LEFT ARM Performed at [facility] [address] [place], [place] [address]    Special Requests   Final    BOTTLES DRAWN AEROBIC AND ANAEROBIC Blood Culture adequate volume Performed at [facility], [place] [address], [place], [place] [address]    Culture PENDING  Incomplete   Report Status PENDING  Incomplete      Radiology Studies: Ct Abdomen Pelvis Wo Contrast  Result Date: 10/18/2018 CLINICAL DATA:  84 y/o F; increasing weakness, nausea, vomiting, abdominal pain, pitting edema, leukocytosis. History of ovarian cancer post chemotherapy. EXAM: CT ABDOMEN AND PELVIS WITHOUT CONTRAST TECHNIQUE: Multidetector CT imaging of the abdomen and pelvis was performed following the standard protocol without IV contrast. COMPARISON:  None. FINDINGS: Lower chest: Small right pleural effusion. Patulous fluid-filled esophagus. Hepatobiliary: Right lobe of liver small calcified granuloma. No additional focal liver lesion identified. Cholecystectomy. Pancreas: Unremarkable. No pancreatic ductal dilatation or surrounding inflammatory changes. Spleen: Normal in size without focal abnormality. Adrenals/Urinary Tract:  Adrenal glands are unremarkable. Multiple renal cysts. Right kidney interpolar 12 mm stone. No hydronephrosis. Normal bladder. Stomach/Bowel: Stomach is within normal limits. Appendix not identified, no pericecal inflammation. No evidence of bowel wall thickening, distention, or inflammatory changes. Vascular/Lymphatic: Retroperitoneal and mesenteric lymphadenopathy. Multiple peritoneal masses within the omentum, mesentery, and lesser sac with the largest lesions as follows: Liver hilum 4.2 x 6.7 cm (series 2, image 23), splenic hilum 4.1 x 4.2 cm (series 2, image 24), right upper quadrant 2.7 x 2.1 cm (series 2, image 26), anterior omentum 2.4 x 4.1 cm (series 2, image 32), right lower quadrant 2.2 x 2.6 cm (series 2, image 54), pouch of Douglass 2.5 x 2.6 cm (series 2, image 66). Additionally, there is a mass implanted within the paraumbilical hernia measuring up to 3.2 cm. Small volume ascites. Abdominal aortic calcific atherosclerosis. Reproductive: Uterus and bilateral adnexa are unremarkable.  Other: No abdominal wall hernia or abnormality. No abdominopelvic ascites. Musculoskeletal: No fracture is seen. IMPRESSION: 1. Multiple peritoneal masses, likely peritoneal carcinomatosis, with the largest mass measuring 6.7 cm at the liver hilum. 2. Retroperitoneal and mesenteric lymphadenopathy, probably metastatic. 3. Small right pleural effusion.  Small volume ascites. 4. Patulous fluid-filled esophagus. 5. Right kidney interpolar nonobstructing stone. 6. Aortic Atherosclerosis (ICD10-I70.0). Electronically Signed   By: Kristine Garbe M.D.   On: 10/18/2018 02:03   Dg Chest 2 View  Result Date: 10/18/2018 CLINICAL DATA:  Weakness.  Cancer patient. EXAM: CHEST - 2 VIEW COMPARISON:  10/11/2018 FINDINGS: Improved aeration of the lung bases, now clear. Resolved left effusion. Negative for heart failure. No new area of infiltrate or mass. IMPRESSION: No active cardiopulmonary disease. Electronically Signed    By: Franchot Gallo M.D.   On: 10/30/2018 16:11   Ct Head Wo Contrast  Result Date: 10/18/2018 CLINICAL DATA:  Altered mental status. Questionable history of ovarian carcinoma EXAM: CT HEAD WITHOUT CONTRAST TECHNIQUE: Contiguous axial images were obtained from the base of the skull through the vertex without intravenous contrast. COMPARISON:  None. FINDINGS: Brain: There is mild diffuse atrophy. There is no intracranial mass, hemorrhage, extra-axial fluid collection, or midline shift. There is mild small vessel disease in the centra semiovale bilaterally. There is focal decreased attenuation to the right of the anterior horn of the right lateral ventricle, a finding which may represent a small recent/acute infarct. This finding is best seen on axial slice 19 series 2, sagittal slice 21 series 6, and coronal slice 25 series 5. No other findings suggesting potential acute infarct evident. Vascular: There is no appreciable hyperdense vessel. There is calcification in each carotid siphon region. Skull: The bony calvarium appears intact. Sinuses/Orbits: There is mild mucosal thickening in several ethmoid air cells. Other paranasal sinuses are clear. There is leftward deviation of the nasal septum. Orbits appear symmetric bilaterally except for previous cataract removal on the right. Other: Mastoid air cells are clear. IMPRESSION: 1. Decreased attenuation adjacent to the anterior horn the right lateral ventricle. Suspect recent and potentially acute focal infarct in this area of the inferior right frontal lobe. There is mild periventricular small vessel disease. No mass or hemorrhage evident. 2. There are foci of arterial vascular calcification. There is mucosal thickening in several ethmoid air cells. There is nasal septal deviation. Electronically Signed   By: Lowella Grip III M.D.   On: 10/16/2018 16:22   Mr Brain Wo Contrast  Result Date: 10/16/2018 CLINICAL DATA:  Nausea, vomiting and leg swelling.  Follow-up suspected infarct. History of ovarian cancer. EXAM: MRI HEAD WITHOUT CONTRAST TECHNIQUE: Multiplanar, multiecho pulse sequences of the brain and surrounding structures were obtained without intravenous contrast. COMPARISON:  CT HEAD October 17, 2018 FINDINGS: INTRACRANIAL CONTENTS: No reduced diffusion to suggest acute ischemia or hypercellular tumor. No susceptibility artifact to suggest hemorrhage. The ventricles and sulci are normal for patient's age. Patchy supratentorial white matter FLAIR T2 hyperintensities compatible with mild chronic small vessel ischemic changes. Old small LEFT cerebellar infarct. Old LEFT basal ganglia infarct. Minimal RIGHT parietal encephalomalacia. No suspicious parenchymal signal, masses, mass effect. No abnormal extra-axial fluid collections. No extra-axial masses. VASCULAR: Normal major intracranial vascular flow voids present at skull base. SKULL AND UPPER CERVICAL SPINE: No abnormal sellar expansion. Heterogeneous calvarial bone marrow signal without diffusion abnormality to suggest metastasis. Craniocervical junction maintained. SINUSES/ORBITS: The mastoid air-cells and included paranasal sinuses are well-aerated.The included ocular globes and orbital contents are non-suspicious. Status post  RIGHT ocular lens implant. Status post bilateral ocular lens implants. OTHER: Patient is edentulous. IMPRESSION: 1. No acute intracranial process. 2. 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 Old small RIGHT parietal/MCA territory infarct versus TBI. 3. Mild chronic small vessel ischemic changes. Electronically Signed   By: Elon Alas M.D.   On: 10/27/2018 19:22   Vas [place] Lower Extremity Venous (dvt)  Result Date: 10/18/2018  Lower Venous Study Indications: Pain, Swelling, Edema, SOB, and Chest pain, abdominal pain.  Risk Factors: Cancer Ovarian Cancer. Limitations: Body habitus and Pain,discomfort and severe pitting edema, immobility. Performing  Technologist: Rudell Cobb  Examination Guidelines: A complete evaluation includes B-mode imaging, spectral Doppler, color Doppler, and power Doppler as needed of all accessible portions of each vessel. Bilateral testing is considered an integral part of a complete examination. Limited examinations for reoccurring indications may be performed as noted.  Right Venous Findings: +---------+---------------+---------+-----------+---------------+--------------+          CompressibilityPhasicitySpontaneityProperties     Summary        +---------+---------------+---------+-----------+---------------+--------------+ CFV      None           Yes      Yes        partially      Acute                                                      re-cannalized                 +---------+---------------+---------+-----------+---------------+--------------+ SFJ      None                                              Acute          +---------+---------------+---------+-----------+---------------+--------------+ FV Prox  None                               partially      Acute                                                      re-cannalized                 +---------+---------------+---------+-----------+---------------+--------------+ FV Mid   Partial                                           Acute          +---------+---------------+---------+-----------+---------------+--------------+ FV DistalNone                    No                        Acute          +---------+---------------+---------+-----------+---------------+--------------+ PFV      None  Acute          +---------+---------------+---------+-----------+---------------+--------------+ POP      None                                              Acute          +---------+---------------+---------+-----------+---------------+--------------+ PTV                               No                        not well                                                                  visualized                                                                with                                                                      compression                                                               due to severe                                                             pitting edema  +---------+---------------+---------+-----------+---------------+--------------+ PERO                             No                        not well                                                                  visualized  with                                                                      compression                                                               due to severe                                                             pitting edema  +---------+---------------+---------+-----------+---------------+--------------+ GSV      None                    No                                       +---------+---------------+---------+-----------+---------------+--------------+ Multiple prominent Hypochoic lesions seen at groin area with non-vascularity. Etiology unknown.  Right Technical Findings: Patient right external iliac vein area could not fully exam due to patient's discomfort and pain, she refused the probe to touch.  Left Venous Findings: +---------+---------------+---------+-----------+----------+-------------------+          CompressibilityPhasicitySpontaneityPropertiesSummary             +---------+---------------+---------+-----------+----------+-------------------+ CFV      None                    No                   Acute               +---------+---------------+---------+-----------+----------+-------------------+ SFJ       None                                         Acute               +---------+---------------+---------+-----------+----------+-------------------+ FV Prox  None                    No                   Acute               +---------+---------------+---------+-----------+----------+-------------------+ FV Mid   None                    No                   Acute               +---------+---------------+---------+-----------+----------+-------------------+ FV Distal                        No  poor visualization                                                        with no flow                                                              detected.           +---------+---------------+---------+-----------+----------+-------------------+ PFV                                                   Not visualized      +---------+---------------+---------+-----------+----------+-------------------+ POP      None                    No                   Acute               +---------+---------------+---------+-----------+----------+-------------------+ PTV                                                   not well visualized                                                       with compression                                                          due to severe                                                             pitting edema       +---------+---------------+---------+-----------+----------+-------------------+ PERO                                                  not well visualized                                                       with  compression                                                          due to severe                                                             pitting edema       +---------+---------------+---------+-----------+----------+-------------------+ Multiple  prominent Hypochoic lesions seen at groin area with non-vascularity. Etiology unknown.  Left Technical Findings: Left external iliac vein thrombosis appears.   Summary: Right: Findings consistent with acute deep vein thrombosis involving the right common femoral vein, right femoral vein, right proximal profunda vein, and right popliteal vein. Findings consistent with acute superficial vein thrombosis involving the right  great saphenous vein. No cystic structure found in the popliteal fossa. Left: Findings consistent with acute deep vein thrombosis involving the left common femoral vein, left femoral vein, and left popliteal vein. No cystic structure found in the popliteal fossa.  *See table(s) above for measurements and observations. Electronically signed by Curt Jews MD on 10/18/2018 at 2:03:24 PM.    Final      Medications:  Scheduled: . apixaban  10 mg Oral BID   Followed by  . [START ON 10/26/2018] apixaban  5 mg Oral BID  . atorvastatin  80 mg Oral q1800  . dexamethasone  4 mg Oral Daily  . letrozole  2.5 mg Oral Daily  . mirtazapine  7.5 mg Oral Daily   Continuous: . sodium chloride 75 mL/hr at 10/18/18 1729  . cefTRIAXone (ROCEPHIN)  IV 1 g (10/18/18 2234)   HAL:[id number], morphine injection, nitroGLYCERIN, ondansetron (ZOFRAN) IV, phenol, zolpidem    Assessment/Plan:  Urinary tract infection with sepsis Patient met criteria for sepsis at admission with leukocytosis tachypnea and elevated lactic acid level.  UA was noted to be abnormal.  Patient was started on ceftriaxone.  Urine culture without any growth.  Blood cultures without any growth so far.  Lactic acid level was initially elevated and improved to 2.7.  Procalcitonin 0.36.  Continue ceftriaxone for now.  She remains afebrile.  WBC has improved.  Acute renal failure on chronic kidney disease stage III Baseline creatinine around 1.6.  Creatinine elevated at 2.4 at admission.  Patient was given IV fluids.  Lasix on  hold.  Creatinine has improved to 1.89.  Monitor urine output.  No hydronephrosis noted on CT scan.  Renal cysts were noted.   Acute bilateral lower extremity DVT Patient noted to have significant edema of lower extremities.  Doppler study positive for DVT.  Most likely due to her cancer.  Patient was started on IV heparin.  However significant drop in her platelet counts noted this morning.  Discussed with Dr. Quintella Reichert who recommends changing to Xarelto or apixaban.  Patient started on apixaban.  Thrombocytopenia Significant drop in platelet counts noted.  Etiology likely multifactorial including cancer, consumption, possible heparin-induced thrombocytopenia.  Heparin has been discontinued.  Monitor counts closely.  History of ovarian cancer with peritoneal carcinomatosis and retroperitoneal lymphadenopathy Patient is on letrozole which is being continued.  Patient was seen by Dr. Lindi Adie with  oncology last month.  He had recommended hospice as the patient is not a candidate for any aggressive interventions and he feels that the cancer will just continue to get worse.  Discussed with Dr. Lindi Adie today and he recommends the same.  He recommends a palliative medicine consult as patient's son appears to be resisting any talks of hospice.  Patient remains on dexamethasone.    Elevated troponin and chest pain Thought to be secondary to demand ischemia from sepsis.  Troponin levels have been flat.  Patient was started on aspirin and statin.  LDL 136.  Cardiology was consulted.  In view of her other comorbidities she is not a candidate for further cardiac testing.  HbA1c 5.1.  EKG showed ventricular bigeminy.  Stop aspirin.  Echocardiogram was ordered and is pending.  Acute metabolic encephalopathy Most likely due to all of the above.  CT scan did raise concern for stroke.  MRI however did not show any acute stroke.  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  Abdominal pain Most likely due to ovarian cancer and peritoneal  carcinomatosis.  There was also concern for UTI which is being treated.  Hypercalcemia Calcium was mildly elevated at admission at 10.9.this is most likely due to dehydration.  Improved to 10.3 today.    DVT Prophylaxis: IV heparin was changed over to apixaban today    Code Status: Full code Family Communication: No family at bedside.  Discussed with her son over the phone yesterday.   Disposition Plan: Management as outlined above.  Palliative medicine consult requested.    LOS: 1 day   [facility] Pager [phone] 10/19/2018, 12:45 PM  If 7PM-7AM, please contact night-coverage at [email or web address], password [facility]

## 2018-10-20 ENCOUNTER — Inpatient Hospital Stay (HOSPITAL_COMMUNITY): Payer: Medicare Other

## 2018-10-20 ENCOUNTER — Encounter (HOSPITAL_COMMUNITY): Payer: Self-pay

## 2018-10-20 DIAGNOSIS — R079 Chest pain, unspecified: Secondary | ICD-10-CM

## 2018-10-20 DIAGNOSIS — Z7189 Other specified counseling: Secondary | ICD-10-CM

## 2018-10-20 DIAGNOSIS — R1110 Vomiting, unspecified: Secondary | ICD-10-CM

## 2018-10-20 DIAGNOSIS — R040 Epistaxis: Secondary | ICD-10-CM

## 2018-10-20 DIAGNOSIS — Z515 Encounter for palliative care: Secondary | ICD-10-CM

## 2018-10-20 LAB — BASIC METABOLIC PANEL
Anion gap: 16 — ABNORMAL HIGH (ref 5–15)
BUN: 98 mg/dL — ABNORMAL HIGH (ref 8–23)
CALCIUM: 10.1 mg/dL (ref 8.9–10.3)
CO2: 26 mmol/L (ref 22–32)
Chloride: 102 mmol/L (ref 98–111)
Creatinine, Ser: 1.74 mg/dL — ABNORMAL HIGH (ref 0.44–1.00)
GFR calc Af Amer: 30 mL/min — ABNORMAL LOW (ref >60)
GFR calc non Af Amer: 26 mL/min — ABNORMAL LOW (ref >60)
Glucose, Bld: 109 mg/dL — ABNORMAL HIGH (ref 70–99)
Potassium: 4.8 mmol/L (ref 3.5–5.1)
Sodium: 144 mmol/L (ref 135–145)

## 2018-10-20 LAB — CBC
HCT: 45.3 % (ref 36.0–46.0)
HCT: 42.1 % (ref 36.0–46.0)
Hemoglobin: 13.5 g/dL (ref 12.0–15.0)
Hemoglobin: 13 g/dL (ref 12.0–15.0)
MCH: 30.9 pg (ref 26.0–34.0)
MCH: 30.6 pg (ref 26.0–34.0)
MCHC: 29.8 g/dL — ABNORMAL LOW (ref 30.0–36.0)
MCHC: 30.9 g/dL (ref 30.0–36.0)
MCV: 103.7 fL — ABNORMAL HIGH (ref 80.0–100.0)
MCV: 99.1 fL (ref 80.0–100.0)
Platelets: 117 10*3/uL — ABNORMAL LOW (ref 150–400)
Platelets: 181 10*3/uL (ref 150–400)
RBC: 4.37 MIL/uL (ref 3.87–5.11)
RBC: 4.25 MIL/uL (ref 3.87–5.11)
RDW: 15.1 % (ref 11.5–15.5)
RDW: 15.5 % (ref 11.5–15.5)
WBC: 16.7 10*3/uL — ABNORMAL HIGH (ref 4.0–10.5)
WBC: 17.4 10*3/uL — ABNORMAL HIGH (ref 4.0–10.5)
nRBC: 0 % (ref 0.0–0.2)
nRBC: 0 % (ref 0.0–0.2)

## 2018-10-20 LAB — ECHOCARDIOGRAM COMPLETE
Height: 65 in
Weight: 2881.6 oz

## 2018-10-20 MED ORDER — PROMETHAZINE HCL 25 MG/ML IJ SOLN
12.5000 mg | Freq: Once | INTRAMUSCULAR | Status: AC
  Administered 2018-10-20: 12.5 mg via INTRAVENOUS
  Filled 2018-10-20: qty 1

## 2018-10-20 MED ORDER — OXYMETAZOLINE HCL 0.05 % NA SOLN
2.0000 | Freq: Two times a day (BID) | NASAL | Status: DC
  Administered 2018-10-20 – 2018-10-21 (×2): 2 via NASAL
  Filled 2018-10-20: qty 15

## 2018-10-20 MED ORDER — OXYMETAZOLINE HCL 0.05 % NA SOLN
1.0000 | Freq: Two times a day (BID) | NASAL | Status: DC
  Administered 2018-10-20: 1 via NASAL
  Filled 2018-10-20: qty 15

## 2018-10-20 MED ORDER — LIDOCAINE 5 % EX PTCH
1.0000 | MEDICATED_PATCH | CUTANEOUS | Status: DC
  Administered 2018-10-20 – 2018-10-23 (×4): 1 via TRANSDERMAL
  Filled 2018-10-20 (×5): qty 1

## 2018-10-20 MED ORDER — GLYCOPYRROLATE 0.2 MG/ML IJ SOLN
0.4000 mg | Freq: Four times a day (QID) | INTRAMUSCULAR | Status: DC
  Administered 2018-10-20 – 2018-10-24 (×17): 0.4 mg via INTRAVENOUS
  Filled 2018-10-20 (×21): qty 2

## 2018-10-20 MED ORDER — ONDANSETRON HCL 4 MG/2ML IJ SOLN
4.0000 mg | Freq: Four times a day (QID) | INTRAMUSCULAR | Status: DC | PRN
  Administered 2018-10-21 – 2018-10-23 (×3): 4 mg via INTRAVENOUS
  Filled 2018-10-20 (×4): qty 2

## 2018-10-20 NOTE — Progress Notes (Signed)
  Echocardiogram 2D Echocardiogram has been performed.  Christina [name] 10/20/2018, 10:04 AM

## 2018-10-20 NOTE — Progress Notes (Addendum)
[facility] PROGRESS NOTE  [name] JOI:[id number] DOB: 1930-11-24 DOA: 10/12/2018  PCP: Nolene Ebbs, MD  Brief History/Interval Summary: 84 y.o. female with medical history significant of ovarian cancer, depression, leg edema, who presented with abdominal pain, chest pain.  Patient speaks limited [place].  She is originally from [place]. Pt has bilateral leg edema, she was seen in ED on 08/31/2018, and started with Lasix. Per report, pt recently refused hospice care. CT head showed possible focal infection, but MRI is negative for acute stroke and it showed old infarction.  CT abdomen/pelvis that showed possible peritoneal carcinomatosis.  Patient is admitted to telemetry bed as inpatient.   Reason for Visit: Abdominal pain due to peritoneal carcinomatosis.  Urinary tract infection.  Acute bilateral lower extremity DVT  Consultants: None  Procedures:   Lower extremity venous Doppler Positive Findings consistent with Acute Deep Vein Thrombosis involving the right common femoral vein, right femoral vein, right proximal profunda vein, and right popliteal vein, the left common femoral vein, left femoral vein, and left popliteal vein. Findings consistent with acute superficial vein thrombosis involving the right great saphenous vein.  Exam also attempted to approach bilateral external iliac veins. Left external iliac vein appears thrombosis. Right side unable to approach due to patient's refusal.  Incidental findings: Multiple prominent Hypochoic lesions seen at groin area bilaterally with non-vascularity. Etiology unknown   Antibiotics: Ceftriaxone  Subjective/Interval History: Overnight patient was noted to be spitting up blood.  No obvious hematemesis has been noted.  It is unclear if she is coughing up this blood.  Patient unable to provide much information at this time.  Objective:  Vital Signs  Vitals:   10/19/18 1232 10/19/18 2023 10/20/18 0210 10/20/18 0433  BP:  132/73 (!) 138/53 135/68 (!) 146/56  Pulse: 74 (!) 43  (!) 46  Resp:  20 20   Temp: 98.3 F (36.8 C) 97.8 F (36.6 C) 98 F (36.7 C) 97.6 F (36.4 C)  TempSrc: Oral Oral Oral Oral  SpO2: 98% 98% 95% 99%  Weight:      Height:        Intake/Output Summary (Last 24 hours) at 10/20/2018 1154 Last data filed at 10/20/2018 0544 Gross per 24 hour  Intake 2257.23 ml  Output 1000 ml  Net 1257.23 ml   Filed Weights   10/18/18 1714  Weight: 81.7 kg    General appearance: She is awake.  Lethargic.  Distracted.  In no distress Blood noted in both of her nostrils.  Back of her throat was attempted to be visualized using a tongue depressor.  Some blood was noted. Resp: Mildly tachypneic at rest.  Diminished air entry at the bases.  Few crackles at the bases.  No wheezing. Cardio: S1-S2 is normal regular.  No S3-S4.  No rubs murmurs or bruit GI: Abdomen remains soft.  Tenderness noted diffusely.  Mildly distended.  Bowel sounds sluggish.  No masses or organomegaly. Extremities: Bilateral lower extremity edema noted about 2+ Neurologic: No obvious focal neurological deficits noted.  Moving all her extremities.  Lab Results:  Data Reviewed: I have personally reviewed following labs and imaging studies  CBC: Recent Labs  Lab 10/26/2018 1509 10/19/18 0454 10/20/18 0330  WBC 18.1* 13.8* 16.7*  HGB 16.5* 13.0 13.5  HCT 51.4* 40.5 45.3  MCV 96.1 97.4 103.7*  PLT 160 92* 117*    Basic Metabolic Panel: Recent Labs  Lab 11/04/2018 1509 10/18/18 1613 10/19/18 0454 10/20/18 0330  NA 135  --  143 144  K 4.0  --  3.5 4.8  CL 93*  --  98 102  CO2 26  --  30 26  GLUCOSE 121*  --  91 109*  BUN 101*  --  102* 98*  CREATININE 2.43*  --  1.89* 1.74*  CALCIUM 10.9*  --  10.3 10.1  MG  --  2.4  --   --     GFR: Estimated Creatinine Clearance: 25 mL/min (A) (by C-G formula based on SCr of 1.74 mg/dL (H)).  Liver Function Tests: Recent Labs  Lab 10/27/2018 1509  AST 35  ALT 18    ALKPHOS 100  BILITOT 1.7*  PROT 6.9  ALBUMIN 2.8*    Recent Labs  Lab 10/18/18 0500  LIPASE 25   Coagulation Profile: Recent Labs  Lab 10/18/18 0500  INR 1.43    Cardiac Enzymes: Recent Labs  Lab 10/21/2018 1509 10/18/18 0500 10/18/18 0820 10/18/18 1202  TROPONINI 0.05* 0.05* 0.04* 0.04*    HbA1C: Recent Labs    10/18/18 0500  HGBA1C 5.1    Lipid Profile: Recent Labs    10/18/18 0500  CHOL 211*  HDL 26*  LDLCALC 136*  TRIG 245*  CHOLHDL 8.1     Recent Results (from the past 240 hour(s))  Urine culture     Status: None   Collection Time: 10/10/2018  3:22 PM  Result Value Ref Range Status   Specimen Description   Final    URINE, RANDOM Performed at [facility], [place] [address], [place], [place] [address]    Special Requests   Final    NONE Performed at [facility], [place] [address] [place], [place] [address]    Culture   Final    NO GROWTH Performed at [facility], [place] [address] [place], [place] [address]    Report Status 10/19/2018 FINAL  Final  Culture, blood (x 2)     Status: None (Preliminary result)   Collection Time: 10/18/18  8:20 AM  Result Value Ref Range Status   Specimen Description   Final    BLOOD LEFT ARM Performed at [facility] [address] [place], [place] [address]    Special Requests   Final    BOTTLES DRAWN AEROBIC AND ANAEROBIC Blood Culture adequate volume   Culture   Final    NO GROWTH 2 DAYS Performed at [facility], [place] [address] [place], [place] [address]    Report Status PENDING  Incomplete  Culture, blood (x 2)     Status: None (Preliminary result)   Collection Time: 10/18/18 12:02 PM  Result Value Ref Range Status   Specimen Description   Final    BLOOD LEFT HAND Performed at [facility] [address] [place], [place] [address]    Special Requests   Final    BOTTLES DRAWN AEROBIC ONLY Blood Culture results  may not be optimal due to an inadequate volume of blood received in culture bottles Performed at [facility] [address] [place], [place] [address]    Culture   Final    NO GROWTH 2 DAYS Performed at [facility] [address] [place], [place] [address]    Report Status PENDING  Incomplete      Radiology Studies: Dg Chest Port 1 View  Result Date: 10/20/2018 CLINICAL DATA:  Dyspnea EXAM: PORTABLE CHEST 1 VIEW COMPARISON:  October 17, 2017 FINDINGS: The heart, hila, and  mediastinum are unchanged. Increased infiltrate in left base with obscuration left hemidiaphragm. There may be a small associated effusion. Mild increased interstitial opacity throughout the right lung, more focal in the right base. No other interval changes. IMPRESSION: 1. New infiltrate in the left base with probable small associated effusion. 2. New increased interstitial opacity in the right lung, little more focal in the right base may represent asymmetric edema or an infectious process. Electronically Signed   By: Dorise Bullion III M.D   On: 10/20/2018 09:06     Medications:  Scheduled: . atorvastatin  80 mg Oral q1800  . dexamethasone  4 mg Oral Daily  . glycopyrrolate  0.4 mg Intravenous QID  . letrozole  2.5 mg Oral Daily  . mirtazapine  7.5 mg Oral Daily  . oxymetazoline  2 spray Each Nare BID   Continuous: . sodium chloride 75 mL/hr at 10/20/18 1024  . cefTRIAXone (ROCEPHIN)  IV Stopped (10/19/18 2349)   FOY:DXAJOINOMVEHM, bisacodyl, metoCLOPramide (REGLAN) injection, morphine injection, nitroGLYCERIN, ondansetron (ZOFRAN) IV, phenol    Assessment/Plan:  Questionable hemoptysis versus epistaxis Overnight patient started spitting up blood.  She has not been noticed to have any obvious hematemesis.  She is unable to provide much information this morning.  Her nostrils were examined and noted to have bleeding.  She has blood in the back of her throat.  Quite possible  she has epistaxis.  Chest x-ray shows findings in the right lung suspicious for aspiration.  Instructed nurse to monitor her pulse ox and see if he can discontinue her oxygen.  Afrin nasal spray.  Will hold patient's Eliquis for now.  Hemoglobin stable this morning.  Repeated and was noted to be stable again.  Urinary tract infection with sepsis Patient met criteria for sepsis at admission with leukocytosis tachypnea and elevated lactic acid level.  UA was noted to be abnormal.  Patient was started on ceftriaxone.  Urine culture without any growth.  Blood cultures without any growth so far.  Lactic acid level was initially elevated and improved to 2.7.  Procalcitonin 0.36.  WBC noted to be higher today.  She is afebrile.  Continue ceftriaxone for now.  Possible aspiration pneumonia Chest x-ray suggest aspiration.  Continue with current antibiotics for now.  Continuous pulse ox for now.  Acute renal failure on chronic kidney disease stage III Baseline creatinine around 1.6.  Creatinine elevated at 2.4 at admission.  Patient was given IV fluids.  Lasix on hold.  Creatinine has improved to 1.74 this morning.  Monitor urine output.  No hydronephrosis noted on CT scan.  Renal cysts were noted.   Acute bilateral lower extremity DVT Patient noted to have significant edema of lower extremities.  Doppler study positive for DVT.  Most likely due to her cancer.  Patient was started on IV heparin.  However significant drop in her platelet counts noted on the morning of 12/12.  Discussed with Dr. Lindi Adie who recommended changing to Xarelto or apixaban.  Patient started on apixaban.  However due to epistaxis versus hemoptysis this will be discontinued for now.  See above.  Thrombocytopenia Significant drop in platelet counts was noted on 12/12.  Stable this morning.  Etiology likely multifactorial including cancer, consumption, possible heparin-induced thrombocytopenia.  Heparin was discontinued.  Monitor counts  closely.  History of ovarian cancer with peritoneal carcinomatosis and retroperitoneal lymphadenopathy Patient is on letrozole which is being continued.  Patient was seen by Dr. Lindi Adie with oncology last month.  He had recommended hospice  as the patient is not a candidate for any aggressive interventions and he feels that the cancer will just continue to get worse.  Discussed with Dr. Lindi Adie today and he recommends the same.  He recommends a palliative medicine consult as patient's son appears to be resisting any talks of hospice.  Patient remains on dexamethasone.  Palliative medicine is following.  Elevated troponin and chest pain Thought to be secondary to demand ischemia from sepsis.  Troponin levels have been flat.  Patient was started on aspirin and statin.  LDL 136.  Cardiology was consulted.  In view of her other comorbidities she is not a candidate for further cardiac testing.  HbA1c 5.1.  EKG showed ventricular bigeminy.  Aspirin was discontinued.  Echocardiogram is pending.    Acute metabolic encephalopathy Most likely due to all of the above.  CT scan did raise concern for stroke.  MRI however did not show any acute stroke.  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  Abdominal pain Most likely due to ovarian cancer and peritoneal carcinomatosis.  Pain medications as needed.  Hypercalcemia Calcium was mildly elevated at admission at 10.9.this is most likely due to dehydration.  Calcium level has improved to 10.1.    DVT Prophylaxis: Apixaban placed on hold due to bleeding issues as discussed above. Code Status: Full code Family Communication: No family at bedside Disposition Plan: Management as outlined above.  Monitor closely for worsening in bleeding.  Palliative medicine following.    LOS: 2 days   [facility] Pager [phone] 10/20/2018, 11:54 AM  If 7PM-7AM, please contact night-coverage at [email or web address], password [facility]

## 2018-10-20 NOTE — Progress Notes (Addendum)
CSW received a call from social worker [place] stating this patient has an open case and is followed in the community. The social worker has requested CSW provide clinical documentation about patient discharge. CSW placed the formal DSS request form on the chart.  CSW will continue to follow this patient.   Kathrin Greathouse, Marlinda Mike, MSW Clinical Social Worker  [phone] 10/20/2018  2:57 PM

## 2018-10-20 NOTE — Progress Notes (Signed)
Palliative:  I met today with Ms. Mormino but unfortunately I have just missed her son who just recently left. I communicated with Ms. Zobrist via [place] interpreter "Vladamir" video chat available in room. Ms. Digioia appears less distressed than yesterday but still appears to not be comfortable and is restless in bed. I am able to learn that she has abd and low back pain that has been present for the past ~2 months but worsening. Her nausea and vomiting/spitting is improved today.   I had a frank conversation with Ms. Halberg about the status of her worsening cancer and that the medical team does not believe there is any intervention that will improve her cancer. I encouraged her that the best we can do for her in this stage is to keep her comfortable so she does not suffer. She is appreciative of these efforts. I also discussed with her code status and if she would desire CPR ("pumping on her chest" I said) or machines that would breathe for her. She asked what this would mean and I further explained that these measures would not help her cancer and that I fear this would only bring her more suffering at the end of her life. She tells me she would not want this and to let her go. I told her that we will do everything that we can to offer her comfort during these times. We also discussed that I am worried about her son and she tells me via interpreter that she worries because when she is gone he will have nobody. Emotional support provided to patient.   I did call and speak with her son. I attempted to explain my conversation with his mother. However, when I share what she shared with me regarding her wishes he tells me that he cannot believe she said she would not want to have [place] pump on her chest and connected to life support machines. I attempted to explain to him why this may not be a good idea and why his mother would not want these interventions BUT he very inappropriately changes the subject. He tells me  that he will talk with his mother tomorrow and proceeds to ask me how I am doing and then discusses the weather.   I updated RN and Dr. Rowe Pavy who is present for palliative care over the weekend.   Plan: - Will need further discussion with son to get him on board with his mother's wishes. He appears to be in denial and refuses to consider what the future might hold in my conversations. He feels that his mother is better today. His main concern is blood clots since his father died from blood clots.  - Continue Robinul scheduled. Nausea/secretions have been improved.  - Pain: Lidoderm patch to low back. Morphine 1 mg IV every 4 hours prn.   71 min  Vinie Sill, NP Palliative Medicine Team Pager # [phone] (M-F 8a-5p) Team Phone # [phone] (Nights/Weekends)

## 2018-10-20 NOTE — Progress Notes (Signed)
   10/20/18 1453  Clinical Encounter Type  Visited With Patient;Other (Comment) Engineer, maintenance (IT))  Visit Type Initial  Referral From Palliative care team  Consult/Referral To Chaplain  The chaplain responded to PMT request spiritual care visit.  At the time of the visit, the chaplain introduced herself to the Pt.'s son and the family's translator in the room with the Pt.  The chaplain offered spiritual care to the Pt. through the translator before leaving the room. The Pt. declined. The Pt. son never acknowledged the chaplain's presence in the room.  Outside the room the chaplain aided the Pt. son in purchasing crackers for the Pt. and checking to see if the snack was acceptable choice for the Pt.  The Pt. son thanked the chaplain for the assistance.

## 2018-10-20 NOTE — Progress Notes (Signed)
Patient had only voided 100cc of urine since 7a. Bladder scan performed and patient retaining at least 600cc of urine. MD made aware and order received to place foley catheter for urinary retention.

## 2018-10-20 NOTE — Progress Notes (Addendum)
Patient has vomited 10cc Q 5 min tonight. No medication helps. It was dark brown/black bile, now it is bloody. Am having CBC drawn early, NP notified, will await result and call. Pt has carcinomatosis and is a full code. Will continue to monitor. Hoyle Barr, RN  0530 had Rapid Response nurse come to bedside for unofficial assessment. Pt continues to spit out blood which sounds like it is coming from her esophagus. She is not vomiting, she is belching or coughing then spitting out blood. Her VS have not changed, her morning labs had not changed from yesterday. Pt is on Eliquis for DVTs.   Noted mention of a patulous esophagus on CT scan. Let patient call her son, she spoke with him briefly. Language barrier present as pt speaks [place]. Will continue to monitor, suction ready at bedside if needed. Patient is on telemetry, had short run of atrial tachycardia. Patient denies any symptoms. Hoyle Barr, RN

## 2018-10-20 NOTE — Progress Notes (Signed)
Son called and wanted to talk to his mother on the nurses phone, not on the room phone. Held phone for pt, sounded like they were yelling at each other and when pt handed me the phone back he had hung up. Hoyle Barr, RN

## 2018-10-20 NOTE — Care Management Important Message (Signed)
Important Message  Patient Details  Name: Christina [name] MRN: [id number] Date of Birth: 23-Mar-1931   Medicare Important Message Given:  Yes    Kerin Salen 10/20/2018, 12:09 [place] Message  Patient Details  Name: Christina [name] MRN: [id number] Date of Birth: 06-23-1931   Medicare Important Message Given:  Yes    Kerin Salen 10/20/2018, 12:09 PM

## 2018-10-21 ENCOUNTER — Inpatient Hospital Stay (HOSPITAL_COMMUNITY): Payer: Medicare Other

## 2018-10-21 DIAGNOSIS — J690 Pneumonitis due to inhalation of food and vomit: Secondary | ICD-10-CM

## 2018-10-21 LAB — HEMOGLOBIN AND HEMATOCRIT, BLOOD
HCT: 39.8 % (ref 36.0–46.0)
Hemoglobin: 12.2 g/dL (ref 12.0–15.0)

## 2018-10-21 LAB — COMPREHENSIVE METABOLIC PANEL
ALBUMIN: 2.9 g/dL — AB (ref 3.5–5.0)
ALT: 13 U/L (ref 0–44)
ANION GAP: 10 (ref 5–15)
AST: 21 U/L (ref 15–41)
Alkaline Phosphatase: 73 U/L (ref 38–126)
BUN: 106 mg/dL — ABNORMAL HIGH (ref 8–23)
CALCIUM: 9.7 mg/dL (ref 8.9–10.3)
CO2: 26 mmol/L (ref 22–32)
Chloride: 107 mmol/L (ref 98–111)
Creatinine, Ser: 1.53 mg/dL — ABNORMAL HIGH (ref 0.44–1.00)
GFR calc Af Amer: 36 mL/min — ABNORMAL LOW (ref >60)
GFR calc non Af Amer: 31 mL/min — ABNORMAL LOW (ref >60)
Glucose, Bld: 137 mg/dL — ABNORMAL HIGH (ref 70–99)
Potassium: 4.3 mmol/L (ref 3.5–5.1)
Sodium: 143 mmol/L (ref 135–145)
Total Bilirubin: 0.9 mg/dL (ref 0.3–1.2)
Total Protein: 5.7 g/dL — ABNORMAL LOW (ref 6.5–8.1)

## 2018-10-21 LAB — CBC
HCT: 39.9 % (ref 36.0–46.0)
Hemoglobin: 12.2 g/dL (ref 12.0–15.0)
MCH: 30.9 pg (ref 26.0–34.0)
MCHC: 30.6 g/dL (ref 30.0–36.0)
MCV: 101 fL — ABNORMAL HIGH (ref 80.0–100.0)
Platelets: 186 10*3/uL (ref 150–400)
RBC: 3.95 MIL/uL (ref 3.87–5.11)
RDW: 15.5 % (ref 11.5–15.5)
WBC: 15.8 10*3/uL — ABNORMAL HIGH (ref 4.0–10.5)
nRBC: 0 % (ref 0.0–0.2)

## 2018-10-21 MED ORDER — PROMETHAZINE HCL 25 MG/ML IJ SOLN
12.5000 mg | Freq: Once | INTRAMUSCULAR | Status: AC
  Administered 2018-10-21: 12.5 mg via INTRAVENOUS
  Filled 2018-10-21: qty 1

## 2018-10-21 MED ORDER — SALINE SPRAY 0.65 % NA SOLN
2.0000 | Freq: Three times a day (TID) | NASAL | Status: DC
  Administered 2018-10-21 – 2018-10-24 (×9): 2 via NASAL
  Filled 2018-10-21: qty 44

## 2018-10-21 MED ORDER — PROMETHAZINE HCL 25 MG/ML IJ SOLN
12.5000 mg | Freq: Four times a day (QID) | INTRAMUSCULAR | Status: DC | PRN
  Administered 2018-10-23 – 2018-10-24 (×3): 12.5 mg via INTRAVENOUS
  Filled 2018-10-21 (×3): qty 1

## 2018-10-21 MED ORDER — OXYMETAZOLINE HCL 0.05 % NA SOLN
3.0000 | Freq: Four times a day (QID) | NASAL | Status: DC
  Administered 2018-10-21 – 2018-10-24 (×12): 3 via NASAL
  Filled 2018-10-21: qty 15

## 2018-10-21 MED ORDER — PANTOPRAZOLE SODIUM 40 MG IV SOLR
40.0000 mg | INTRAVENOUS | Status: DC
  Administered 2018-10-21 – 2018-10-24 (×4): 40 mg via INTRAVENOUS
  Filled 2018-10-21 (×4): qty 40

## 2018-10-21 NOTE — Progress Notes (Signed)
Pt resting vomiting has decreased, will continue to monitor patient. SRP, RN

## 2018-10-21 NOTE — Progress Notes (Signed)
Pt continues to vomit coffee ground emesis, MD made aware, attempted to place NGT with another RN assistance, met resistance bilateral nostril, unsuccessful placement will cont to monitor emesis. SRP, RN

## 2018-10-21 NOTE — Progress Notes (Addendum)
[facility] PROGRESS NOTE  Christina [name] NLG:[id number] DOB: 03-22-1931 DOA: 10/11/2018  PCP: Nolene Ebbs, MD  Brief History/Interval Summary: 84 y.o. female with medical history significant of ovarian cancer, depression, leg edema, who presented with abdominal pain, chest pain.  Patient speaks limited [place].  She is originally from [place]. Pt has bilateral leg edema, she was seen in ED on 08/31/2018, and started with Lasix. Per report, pt recently refused hospice care. CT head showed possible focal infection, but MRI is negative for acute stroke and it showed old infarction.  CT abdomen/pelvis that showed possible peritoneal carcinomatosis.  Patient is admitted to telemetry bed as inpatient.   Reason for Visit: Abdominal pain due to peritoneal carcinomatosis.  Urinary tract infection.  Acute bilateral lower extremity DVT  Consultants: None  Procedures:   Lower extremity venous Doppler Positive Findings consistent with Acute Deep Vein Thrombosis involving the right common femoral vein, right femoral vein, right proximal profunda vein, and right popliteal vein, the left common femoral vein, left femoral vein, and left popliteal vein. Findings consistent with acute superficial vein thrombosis involving the right great saphenous vein.  Exam also attempted to approach bilateral external iliac veins. Left external iliac vein appears thrombosis. Right side unable to approach due to patient's refusal.  Incidental findings: Multiple prominent Hypochoic lesions seen at groin area bilaterally with non-vascularity. Etiology unknown  Transthoracic echocardiogram Study Conclusions  - Left ventricle: The cavity size was normal. Systolic function was   vigorous. The estimated ejection fraction was in the range of 65%   to 70%. Wall motion was normal; there were no regional wall   motion abnormalities. The study isindeterminate for the   evaluation of LV diastolic function. - Pericardium,  extracardiac: There was a pleural effusion.  Impressions:  - Significant arrhythmia, including ventricular bigeminy and   nonsustained VT, seen throughout study. Normal LV EF, no   significant valve disease. Technically difficult study due to   patient factors, no subcostal or suprasternal notch windows.  Antibiotics: Ceftriaxone  Subjective/Interval History: Patient states that she feels less fatigued this morning.  Not coughing up any blood this morning.  Continues to have abdominal pain and leg pain.  Poor appetite.    Objective:  Vital Signs  Vitals:   10/20/18 0433 10/20/18 1750 10/20/18 2033 10/21/18 0700  BP: (!) 146/56 (!) 143/60 (!) 149/67 (!) 145/60  Pulse: (!) 46 (!) 49 (!) 54 (!) 50  Resp:   20 18  Temp: 97.6 F (36.4 C) 97.6 F (36.4 C) 97.8 F (36.6 C) 97.6 F (36.4 C)  TempSrc: Oral Oral Oral Oral  SpO2: 99% 97% 94% 94%  Weight:      Height:        Intake/Output Summary (Last 24 hours) at 10/21/2018 0835 Last data filed at 10/21/2018 0610 Gross per 24 hour  Intake 1776.28 ml  Output 1075 ml  Net 701.28 ml   Filed Weights   10/18/18 1714  Weight: 81.7 kg    General appearance: Patient is awake.  Appears to be fatigued but not in any distress Dried blood noted in the nostrils as well as in the back of her throat.   Resp: Respiratory effort has improved.  Few crackles in the right base.  No wheezing or rhonchi.   Cardio: S1-S2 is normal regular.  No S3-S4.  No rubs murmurs or bruit GI: Abdomen is soft.  Tender diffusely without any rebound rigidity or guarding.  Mildly distended.  Bowel sounds sluggish.  No  masses organomegaly  Extremities: Bilateral lower extremity edema noted about 2+ Neurologic: No obvious focal neurological deficits noted.  Moving all her extremities.  Lab Results:  Data Reviewed: I have personally reviewed following labs and imaging studies  CBC: Recent Labs  Lab 11/05/2018 1509 10/19/18 0454 10/20/18 0330  10/20/18 1152 10/21/18 0423  WBC 18.1* 13.8* 16.7* 17.4* 15.8*  HGB 16.5* 13.0 13.5 13.0 12.2  HCT 51.4* 40.5 45.3 42.1 39.9  MCV 96.1 97.4 103.7* 99.1 101.0*  PLT 160 92* 117* 181 706    Basic Metabolic Panel: Recent Labs  Lab 10/25/2018 1509 10/18/18 1613 10/19/18 0454 10/20/18 0330 10/21/18 0423  NA 135  --  143 144 143  K 4.0  --  3.5 4.8 4.3  CL 93*  --  98 102 107  CO2 26  --  _0 GLUCOSE 121*  --  91 109* 137*  BUN 101*  --  102* 98* 106*  CREATININE 2.43*  --  1.89* 1.74* 1.53*  CALCIUM 10.9*  --  10.3 10.1 9.7  MG  --  2.4  --   --   --     GFR: Estimated Creatinine Clearance: 28.4 mL/min (A) (by C-G formula based on SCr of 1.53 mg/dL (H)).  Liver Function Tests: Recent Labs  Lab 10/22/2018 1509 10/21/18 0423  AST 35 21  ALT 18 13  ALKPHOS 100 73  BILITOT 1.7* 0.9  PROT 6.9 5.7*  ALBUMIN 2.8* 2.9*    Recent Labs  Lab 10/18/18 0500  LIPASE 25   Coagulation Profile: Recent Labs  Lab 10/18/18 0500  INR 1.43    Cardiac Enzymes: Recent Labs  Lab 10/10/2018 1509 10/18/18 0500 10/18/18 0820 10/18/18 1202  TROPONINI 0.05* 0.05* 0.04* 0.04*    HbA1C: No results for input(s): HGBA1C in the last 72 hours.   Recent Results (from the past 240 hour(s))  Urine culture     Status: None   Collection Time: 10/20/2018  3:22 PM  Result Value Ref Range Status   Specimen Description   Final    URINE, RANDOM Performed at [facility] [address] [place], [place] [address]    Special Requests   Final    NONE Performed at [facility], [place] [address], [place], [place] [address]    Culture   Final    NO GROWTH Performed at [facility], [place] [address] [place], [place] [address]    Report Status 10/19/2018 FINAL  Final  Culture, blood (x 2)     Status: None (Preliminary result)   Collection Time: 10/18/18  8:20 AM  Result Value Ref Range Status   Specimen Description   Final    BLOOD LEFT  ARM Performed at [facility] [address], [place], [place] [address]    Special Requests   Final    BOTTLES DRAWN AEROBIC AND ANAEROBIC Blood Culture adequate volume   Culture   Final    NO GROWTH 2 DAYS Performed at [facility], [place] [address], [place], [place] [address]    Report Status PENDING  Incomplete  Culture, blood (x 2)     Status: None (Preliminary result)   Collection Time: 10/18/18 12:02 PM  Result Value Ref Range Status   Specimen Description   Final    BLOOD LEFT HAND Performed at [facility] [address] [place], [place] [address]    Special Requests   Final  BOTTLES DRAWN AEROBIC ONLY Blood Culture results may not be optimal due to an inadequate volume of blood received in culture bottles Performed at [facility], [place] [address], [place], [place] [address]    Culture   Final    NO GROWTH 2 DAYS Performed at [facility] [address] [place], [place] [address]    Report Status PENDING  Incomplete      Radiology Studies: Dg Chest Port 1 View  Result Date: 10/20/2018 CLINICAL DATA:  Dyspnea EXAM: PORTABLE CHEST 1 VIEW COMPARISON:  October 17, 2017 FINDINGS: The heart, hila, and mediastinum are unchanged. Increased infiltrate in left base with obscuration left hemidiaphragm. There may be a small associated effusion. Mild increased interstitial opacity throughout the right lung, more focal in the right base. No other interval changes. IMPRESSION: 1. New infiltrate in the left base with probable small associated effusion. 2. New increased interstitial opacity in the right lung, little more focal in the right base may represent asymmetric edema or an infectious process. Electronically Signed   By: Dorise Bullion III M.D   On: 10/20/2018 09:06     Medications:  Scheduled: . atorvastatin  80 mg Oral q1800  . dexamethasone  4 mg Oral Daily  . glycopyrrolate  0.4 mg Intravenous QID   . letrozole  2.5 mg Oral Daily  . lidocaine  1 patch Transdermal Q24H  . mirtazapine  7.5 mg Oral Daily  . oxymetazoline  2 spray Each Nare BID   Continuous: . sodium chloride 75 mL/hr at 10/20/18 2238  . cefTRIAXone (ROCEPHIN)  IV 1 g (10/20/18 2235)   WER:[id number], bisacodyl, metoCLOPramide (REGLAN) injection, morphine injection, nitroGLYCERIN, ondansetron (ZOFRAN) IV, phenol    Assessment/Plan:  Epistaxis Likely brought on by anticoagulation.  She was getting oxygen as well.  Initially it was unclear as she did not have any anterior bleeding.  Patient was prescribed Afrin nasal spray.  She has not spit up any more blood since then.  Hemoglobin noted to be slightly low this morning but overall stable.  Bleeding appears to have subsided.  Continue to monitor.    Possible aspiration pneumonia Chest x-ray suggested aspiration.  Continue IV antibiotics.  Continuous pulse oximetry.    Urinary tract infection with sepsis Patient met criteria for sepsis at admission with leukocytosis tachypnea and elevated lactic acid level.  UA was noted to be abnormal.  Patient was started on ceftriaxone.  Urine culture without any growth.  Blood cultures without any growth so far.  Lactic acid level was initially elevated and improved to 2.7.  Procalcitonin 0.36.  WBC slightly better compared to yesterday.  She remains afebrile.  Continue ceftriaxone for now.  Acute renal failure on chronic kidney disease stage III Baseline creatinine around 1.6.  Creatinine elevated at 2.4 at admission.  Patient was given IV fluids.  Lasix on hold.  Treatment now down to 1.53 which is close to her baseline.  No hydronephrosis was noted on CT scan.  Monitor urine output.  Acute bilateral lower extremity DVT Patient noted to have significant edema of lower extremities.  Doppler study positive for DVT.  Most likely due to her cancer.  Patient was started on IV heparin.  However significant drop in her platelet counts  noted on the morning of 12/12.  Discussed with Dr. Lindi Adie who recommended changing to Xarelto or apixaban.  Patient started on apixaban.  However she developed epistaxis and so we had to hold the apixaban.  Her platelet  counts on the other hand are normal.  We will resume apixaban tomorrow at lower dose if she does not have further bleeding.   Thrombocytopenia Significant drop in platelet counts was noted on 12/12.  Heparin was discontinued.  Platelet count is now normal.  Etiology likely multifactorial including cancer, consumption, possible heparin-induced thrombocytopenia.    History of ovarian cancer with peritoneal carcinomatosis and retroperitoneal lymphadenopathy Patient is on letrozole which is being continued.  Patient was seen by Dr. Lindi Adie with oncology last month.  He had recommended hospice as the patient is not a candidate for any aggressive interventions and he feels that the cancer will just continue to get worse.  Discussed with Dr. Lindi Adie on 12/13 and he recommended the same.  He recommended a palliative medicine consult as patient's son appears to be resisting any talks of hospice.  Patient seen by palliative medicine.  They also communicate difficulty with having this conversation with patient's son as he seems to have unrealistic expectation.  They will continue to work with this patient and her son.  Continue dexamethasone.    Elevated troponin and chest pain Thought to be secondary to demand ischemia from sepsis.  Troponin levels have been flat.  Patient was started on aspirin and statin.  LDL 136.  Cardiology was consulted.  In view of her other comorbidities she is not a candidate for further cardiac testing.  HbA1c 5.1.  EKG showed ventricular bigeminy.  Aspirin was discontinued.  Echocardiogram shows normal systolic function without any wall motion abnormalities.  PVCs noted.  Telemetry continues to show bigeminy and PVCs.  Heart rate in the 90s.  Could consider low-dose  beta-blocker.  Acute metabolic encephalopathy Most likely due to all of the above.  CT scan did raise concern for stroke.  MRI however did not show any acute stroke.  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  Abdominal pain Most likely due to ovarian cancer and peritoneal carcinomatosis.  Pain medications as needed.  Hypercalcemia Calcium was mildly elevated at admission at 10.9.this was most likely due to dehydration.  Improved subsequently.    ADDENDUM Patient reevaluated.  She is still spitting up blood.  No true hematemesis.  Very hard to know if she is  vomiting up swallowed blood or if she has had true hematemesis.  No anterior bleeding noted from the nostrils.  When NG tube was attempted there was blood at the tip of the ET tube suggesting that the patient does have epistaxis.  Discussed with Dr. Wilburn Cornelia with ENT who does not recommend nasal packing at this time.  He recommends continuing with the Afrin nasal spray and saline nasal spray.  Hemoglobin was repeated and noted to be stable.  We will continue to watch.  Abdominal x-ray is pending.  May need to discuss with GI as well.  However she is at high risk for respiratory decompensation if EGD is attempted. PPI  Poor prognosis due to all the above-mentioned issues.  Discussed with Dr. Rowe Pavy with palliative medicine.  DVT Prophylaxis: Apixaban placed on hold due to bleeding issues as discussed above. Code Status: Full code Family Communication: Discussed with patient's son yesterday. Disposition Plan: Management as outlined above.  Monitor closely for worsening in bleeding.  Palliative medicine following.    LOS: 3 days   [facility] Pager [phone] 10/21/2018, 8:35 AM  If 7PM-7AM, please contact night-coverage at [email or web address], password [email or web address]

## 2018-10-21 NOTE — Progress Notes (Signed)
PMT progress note  Patient is a frail, weak appearing lady who is resting in bed. Discussed with [facility] M.D. Discussed with bedside RN. Patient's life limiting illness includes ovarian cancer, peritoneal carcinomatosis. Patient's current hospitalization complicated by the fact that she is having epistaxis or hemoptysis. She is possibly also throwing up swallowed blood. She is being given a nasal spray, her blood thinners are being held. At the moment, her hemoglobin is stable.  There is no family present at the bedside.  BP (!) 144/52 (BP Location: Left Arm)   Pulse (!) 50   Temp 98.8 F (37.1 C) (Axillary)   Resp 20   Ht 5\' 5"  (1.651 m)   Wt 81.7 kg   SpO2 95%   BMI 29.97 kg/m  Labs and imaging noted  Appears weak and fatigued Crackles right base S1-S2 Thin, muscle wasting Has edema  Palliative performance scale 20-30 percent.  Call placed and I spoke with her son Karne Ozga. He stated that he had called earlier today and had instructed the bedside nurses to give her popsicles to eat and that that would help with her vomiting and that that would help with her hemoptysis. He is most concerned with finding out whether or not the patient got offered popsicles or not. He keeps on repeating that his mother is a very nice lady.  I have tried several times over the phone to redirect the conversation back to overall goals of care discussions, discussions regarding DO NOT RESUSCITATE/DO NOT INTUBATE. At this point in time, it does not appear that the son is fully able to participate in the serious nature of these discussions.  Remains full code as of now. Continue efforts to engage with son and or other friends of the family as available.  25 minutes spent. Loistine Chance MD [facility] palliative medicine team [phone]

## 2018-10-21 NOTE — Progress Notes (Addendum)
Pt has several episodes of coffee ground emesis this am, started around 1045 am, medicated as ordered, pt alert stating pain in abd, VS noted, episodes continued not relieved by PRN, MD made aware, orders received. SRP,RN

## 2018-10-21 NOTE — Progress Notes (Signed)
Pt continues to rest without active hematosis/epistaxis noted. More alert taking small sips of water and mouth swabs. SRP, RN

## 2018-10-22 LAB — CBC
HEMATOCRIT: 44.3 % (ref 36.0–46.0)
HEMOGLOBIN: 13.2 g/dL (ref 12.0–15.0)
MCH: 30.9 pg (ref 26.0–34.0)
MCHC: 29.8 g/dL — ABNORMAL LOW (ref 30.0–36.0)
MCV: 103.7 fL — ABNORMAL HIGH (ref 80.0–100.0)
Platelets: 200 10*3/uL (ref 150–400)
RBC: 4.27 MIL/uL (ref 3.87–5.11)
RDW: 15.8 % — ABNORMAL HIGH (ref 11.5–15.5)
WBC: 20.8 10*3/uL — ABNORMAL HIGH (ref 4.0–10.5)
nRBC: 0.1 % (ref 0.0–0.2)

## 2018-10-22 LAB — BASIC METABOLIC PANEL
Anion gap: 11 (ref 5–15)
BUN: 99 mg/dL — ABNORMAL HIGH (ref 8–23)
CO2: 24 mmol/L (ref 22–32)
Calcium: 9.4 mg/dL (ref 8.9–10.3)
Chloride: 111 mmol/L (ref 98–111)
Creatinine, Ser: 1.48 mg/dL — ABNORMAL HIGH (ref 0.44–1.00)
GFR calc non Af Amer: 32 mL/min — ABNORMAL LOW (ref >60)
GFR, EST AFRICAN AMERICAN: 37 mL/min — AB (ref >60)
Glucose, Bld: 114 mg/dL — ABNORMAL HIGH (ref 70–99)
Potassium: 4 mmol/L (ref 3.5–5.1)
SODIUM: 146 mmol/L — AB (ref 135–145)

## 2018-10-22 MED ORDER — DEXAMETHASONE SODIUM PHOSPHATE 4 MG/ML IJ SOLN
4.0000 mg | Freq: Two times a day (BID) | INTRAMUSCULAR | Status: DC
  Administered 2018-10-22 – 2018-10-24 (×5): 4 mg via INTRAVENOUS
  Filled 2018-10-22 (×5): qty 1

## 2018-10-22 NOTE — Progress Notes (Signed)
[facility] PROGRESS NOTE  Christina [name] DZH:[id number] DOB: 31-Aug-1931 DOA: 10/10/2018  PCP: Nolene Ebbs, MD  Brief History/Interval Summary: 84 y.o. female with medical history significant of ovarian cancer, depression, leg edema, who presented with abdominal pain, chest pain.  Patient speaks limited [place].  She is originally from [place]. Pt has bilateral leg edema, she was seen in ED on 08/31/2018, and started with Lasix. Per report, pt recently refused hospice care. CT head showed possible focal infection, but MRI is negative for acute stroke and it showed old infarction.  CT abdomen/pelvis that showed peritoneal carcinomatosis.  Patient is admitted to telemetry bed as inpatient.   Reason for Visit: Abdominal pain due to peritoneal carcinomatosis.  Urinary tract infection.  Acute bilateral lower extremity DVT  Consultants: Palliative medicine  Procedures:   Lower extremity venous Doppler Positive Findings consistent with Acute Deep Vein Thrombosis involving the right common femoral vein, right femoral vein, right proximal profunda vein, and right popliteal vein, the left common femoral vein, left femoral vein, and left popliteal vein. Findings consistent with acute superficial vein thrombosis involving the right great saphenous vein.  Exam also attempted to approach bilateral external iliac veins. Left external iliac vein appears thrombosis. Right side unable to approach due to patient's refusal.  Incidental findings: Multiple prominent Hypochoic lesions seen at groin area bilaterally with non-vascularity. Etiology unknown  Transthoracic echocardiogram Study Conclusions  - Left ventricle: The cavity size was normal. Systolic function was   vigorous. The estimated ejection fraction was in the range of 65%   to 70%. Wall motion was normal; there were no regional wall   motion abnormalities. The study isindeterminate for the   evaluation of LV diastolic function. -  Pericardium, extracardiac: There was a pleural effusion.  Impressions:  - Significant arrhythmia, including ventricular bigeminy and   nonsustained VT, seen throughout study. Normal LV EF, no   significant valve disease. Technically difficult study due to   patient factors, no subcostal or suprasternal notch windows.  Antibiotics: Ceftriaxone  Subjective/Interval History: Patient had an uneventful night for the most part.  Early morning today she had a small episode of emesis with coffee grounds.  Objective:  Vital Signs  Vitals:   10/21/18 1158 10/21/18 1634 10/21/18 2237 10/22/18 0559  BP: 131/62 (!) 144/52 (!) 142/60 (!) 139/54  Pulse: (!) 52 (!) 50 (!) 52 (!) 53  Resp: 20  20 (!) 22  Temp: 97.7 F (36.5 C) 98.8 F (37.1 C) 98.6 F (37 C) 98.5 F (36.9 C)  TempSrc: Oral Axillary Axillary Oral  SpO2: 95% 95% 92% 93%  Weight:      Height:        Intake/Output Summary (Last 24 hours) at 10/22/2018 1129 Last data filed at 10/21/2018 2238 Gross per 24 hour  Intake 749.78 ml  Output 625 ml  Net 124.78 ml   Filed Weights   10/18/18 1714  Weight: 81.7 kg    General appearance: Patient is awake.  Does not appear to be in any distress.  Appears to be fatigued. Dried blood noted in the nostrils as well as in the back of her throat.   Resp: Normal effort noted at rest.  Crackles right base.  No wheezing or rhonchi. Cardio: S1-S2 is normal regular.  No S3-S4.  No rubs murmurs or bruit GI: Abdomen is soft.  Tenderness in the lower abdomen.  No rebound rigidity or guarding.  No masses organomegaly. Extremities: Bilateral lower extremity edema Neurologic: She is awake alert.  No obvious focal neurological deficits noted.  Lab Results:  Data Reviewed: I have personally reviewed following labs and imaging studies  CBC: Recent Labs  Lab 10/19/18 0454 10/20/18 0330 10/20/18 1152 10/21/18 0423 10/21/18 1209 10/22/18 0735  WBC 13.8* 16.7* 17.4* 15.8*  --  20.8*    HGB 13.0 13.5 13.0 12.2 12.2 13.2  HCT 40.5 45.3 42.1 39.9 39.8 44.3  MCV 97.4 103.7* 99.1 101.0*  --  103.7*  PLT 92* 117* 181 186  --  993    Basic Metabolic Panel: Recent Labs  Lab 10/22/2018 1509 10/18/18 1613 10/19/18 0454 10/20/18 0330 10/21/18 0423 10/22/18 1018  NA 135  --  143 144 143 146*  K 4.0  --  3.5 4.8 4.3 4.0  CL 93*  --  98 102 107 111  CO2 26  --  30 26 26 24   GLUCOSE 121*  --  91 109* 137* 114*  BUN 101*  --  102* 98* 106* 99*  CREATININE 2.43*  --  1.89* 1.74* 1.53* 1.48*  CALCIUM 10.9*  --  10.3 10.1 9.7 9.4  MG  --  2.4  --   --   --   --     GFR: Estimated Creatinine Clearance: 29.4 mL/min (A) (by C-G formula based on SCr of 1.48 mg/dL (H)).  Liver Function Tests: Recent Labs  Lab 11/01/2018 1509 10/21/18 0423  AST 35 21  ALT 18 13  ALKPHOS 100 73  BILITOT 1.7* 0.9  PROT 6.9 5.7*  ALBUMIN 2.8* 2.9*    Recent Labs  Lab 10/18/18 0500  LIPASE 25   Coagulation Profile: Recent Labs  Lab 10/18/18 0500  INR 1.43    Cardiac Enzymes: Recent Labs  Lab 10/14/2018 1509 10/18/18 0500 10/18/18 0820 10/18/18 1202  TROPONINI 0.05* 0.05* 0.04* 0.04*     Recent Results (from the past 240 hour(s))  Urine culture     Status: None   Collection Time: 10/26/2018  3:22 PM  Result Value Ref Range Status   Specimen Description   Final    URINE, RANDOM Performed at [facility] [address], [place], [place] [address]    Special Requests   Final    NONE Performed at [facility], [place] [address], [place], [place] [address]    Culture   Final    NO GROWTH Performed at [facility], [place] [address] [place], [place] [address]    Report Status 10/19/2018 FINAL  Final  Culture, blood (x 2)     Status: None (Preliminary result)   Collection Time: 10/18/18  8:20 AM  Result Value Ref Range Status   Specimen Description   Final    BLOOD LEFT ARM Performed at [facility]  [address], [place], [place] [address]    Special Requests   Final    BOTTLES DRAWN AEROBIC AND ANAEROBIC Blood Culture adequate volume   Culture   Final    NO GROWTH 3 DAYS Performed at [facility], [place] [address] [place], [place] [address]    Report Status PENDING  Incomplete  Culture, blood (x 2)     Status: None (Preliminary result)   Collection Time: 10/18/18 12:02 PM  Result Value Ref Range Status   Specimen Description   Final    BLOOD LEFT HAND Performed at [facility] [address], [place], [place] [address]    Special Requests   Final  BOTTLES DRAWN AEROBIC ONLY Blood Culture results may not be optimal due to an inadequate volume of blood received in culture bottles Performed at [facility], [place] [address], [place], [place] [address]    Culture   Final    NO GROWTH 3 DAYS Performed at [facility], [place] [address], [place], [place] [address]    Report Status PENDING  Incomplete      Radiology Studies: Dg Abd Portable 1v  Result Date: 10/21/2018 CLINICAL DATA:  Coffee ground emesis.  History of ovarian cancer. EXAM: PORTABLE ABDOMEN - 1 VIEW COMPARISON:  None. FINDINGS: The bowel gas pattern is normal. No radio-opaque calculi or other significant radiographic abnormality are seen. IMPRESSION: Negative. Electronically Signed   By: Dorise Bullion III M.D   On: 10/21/2018 15:05     Medications:  Scheduled: . atorvastatin  80 mg Oral q1800  . dexamethasone  4 mg Oral Daily  . glycopyrrolate  0.4 mg Intravenous QID  . letrozole  2.5 mg Oral Daily  . lidocaine  1 patch Transdermal Q24H  . mirtazapine  7.5 mg Oral Daily  . oxymetazoline  3 spray Each Nare Q6H  . pantoprazole (PROTONIX) IV  40 mg Intravenous Q24H  . sodium chloride  2 spray Each Nare TID   Continuous: . sodium chloride 75 mL/hr at 10/22/18 0016  . cefTRIAXone (ROCEPHIN)  IV 1 g (10/22/18 0017)   GLO:VFIEPPIRJJOAC, bisacodyl, metoCLOPramide  (REGLAN) injection, morphine injection, nitroGLYCERIN, ondansetron (ZOFRAN) IV, phenol, promethazine    Assessment/Plan:  Epistaxis/coffee-ground emesis Likely brought on by anticoagulation.  She was getting oxygen as well.  Initially it was unclear as she did not have any anterior bleeding.  Patient was prescribed Afrin nasal spray.  Initially her bleeding subsided but then again yesterday she started spitting up and vomiting blood.  Unclear if this was blood that she had previously swallowed.  Surprisingly her hemoglobin remains stable.  She was discussed with the ENT as well who recommended Afrin nasal spray but no packing at this time.  Continue to monitor for now.    Possible aspiration pneumonia Chest x-ray suggested aspiration.  Continue IV antibiotics.  Continuous pulse oximetry.  WBC noted to be higher today.  She is afebrile.  Steroids likely contributing to elevated WBC.  Urinary tract infection with sepsis Patient met criteria for sepsis at admission with leukocytosis tachypnea and elevated lactic acid level.  UA was noted to be abnormal.  Patient was started on ceftriaxone.  Urine culture without any growth.  Blood cultures without any growth so far.  Lactic acid level was initially elevated and improved to 2.7.  Procalcitonin 0.36.  Continue ceftriaxone for now.  Acute renal failure on chronic kidney disease stage III Baseline creatinine around 1.6.  Creatinine elevated at 2.4 at admission.  Patient was given IV fluids.  Lasix on hold.  Creatinine has improved to 1.48.  BUN remains elevated. No hydronephrosis was noted on CT scan.  Monitor urine output.  Acute bilateral lower extremity DVT Patient noted to have significant edema of lower extremities.  Doppler study positive for DVT.  Most likely due to her cancer.  Patient was initially started on IV heparin.  However significant drop in her platelet counts noted on the morning of 12/12.  Discussed with Dr. Lindi Adie who recommended  changing to Xarelto or apixaban.  Patient started on apixaban.  However she developed epistaxis and so we had to hold the apixaban.  Her platelet counts have improved and  are now normal.  Initially plan was to resume apixaban today however in view of her significant bleeding episode yesterday we will wait an additional 24 hours.    Thrombocytopenia Significant drop in platelet counts was noted on 12/12.  Heparin was discontinued.  Platelet count is now normal. Etiology likely multifactorial including cancer, consumption, possible heparin-induced thrombocytopenia.    History of ovarian cancer with peritoneal carcinomatosis and retroperitoneal lymphadenopathy Patient is on letrozole which is being continued.  Patient was seen by Dr. Lindi Adie with oncology last month.  He had recommended hospice as the patient is not a candidate for any aggressive interventions and he feels that the cancer will just continue to get worse.  Discussed with Dr. Lindi Adie on 12/13 and he recommended the same.  He recommended a palliative medicine consult as patient's son appears to be resisting any talks of hospice.  Patient seen by palliative medicine.  They also communicate difficulty with having this conversation with patient's son as he seems to have unrealistic expectation.  They will continue to work with this patient and her son.  Continue dexamethasone.    Elevated troponin and chest pain Thought to be secondary to demand ischemia from sepsis.  Troponin levels have been flat.  Patient was started on aspirin and statin.  LDL 136.  Cardiology was consulted.  In view of her other comorbidities she is not a candidate for further cardiac testing.  HbA1c 5.1.  EKG showed ventricular bigeminy.  Aspirin was discontinued.  Echocardiogram shows normal systolic function without any wall motion abnormalities.  PVCs noted.  Telemetry continues to show bigeminy and PVCs.  Heart rate in the 90s.   Acute metabolic encephalopathy Most likely  due to all of the above.  CT scan did raise concern for stroke.  MRI however did not show any acute stroke.  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  Abdominal pain Most likely due to ovarian cancer and peritoneal carcinomatosis.  Abdominal film done yesterday did not show anything concerning.  She could have a low level obstruction versus ileus which could be causing her to have nausea and vomiting.  Hypercalcemia Calcium was mildly elevated at admission at 10.9.this was most likely due to dehydration.  Improved subsequently.    DVT Prophylaxis: Apixaban placed on hold due to bleeding issues as discussed above. Code Status: Full code Family Communication: No family at bedside Disposition Plan: Management as outlined above.  Palliative medicine is following.  Prognosis remains poor.    LOS: 4 days   [facility] Pager [phone] 10/22/2018, 11:29 AM  If 7PM-7AM, please contact night-coverage at [email or web address], password [facility]

## 2018-10-22 NOTE — Progress Notes (Signed)
Son and other family members in to visit pt, pt more alert talking with family, spitting up dark brown emesis intermittently. Uses suction at times, tolerating ice chips. Will cont to monitor. SRP, RN

## 2018-10-22 NOTE — Progress Notes (Signed)
Received report this am pt begin to spit up coffee ground emesis  @ 6am, Reglan given 0715, pt resting more alert and talking, taking in a few ice chip. Will cont to monitor. SRP, RN

## 2018-10-22 NOTE — Progress Notes (Signed)
Pt remain alert and talking with family throughout the day. Intermittent small amount of coffee ground emesis. VSs. will cont to monitor. SRP,RN

## 2018-10-23 ENCOUNTER — Inpatient Hospital Stay (HOSPITAL_COMMUNITY): Payer: Medicare Other

## 2018-10-23 DIAGNOSIS — R591 Generalized enlarged lymph nodes: Secondary | ICD-10-CM

## 2018-10-23 DIAGNOSIS — C562 Malignant neoplasm of left ovary: Secondary | ICD-10-CM

## 2018-10-23 DIAGNOSIS — Z66 Do not resuscitate: Secondary | ICD-10-CM

## 2018-10-23 DIAGNOSIS — N390 Urinary tract infection, site not specified: Secondary | ICD-10-CM

## 2018-10-23 LAB — BASIC METABOLIC PANEL
Anion gap: 12 (ref 5–15)
BUN: 92 mg/dL — ABNORMAL HIGH (ref 8–23)
CALCIUM: 9.4 mg/dL (ref 8.9–10.3)
CO2: 22 mmol/L (ref 22–32)
Chloride: 116 mmol/L — ABNORMAL HIGH (ref 98–111)
Creatinine, Ser: 1.39 mg/dL — ABNORMAL HIGH (ref 0.44–1.00)
GFR calc Af Amer: 40 mL/min — ABNORMAL LOW (ref >60)
GFR calc non Af Amer: 34 mL/min — ABNORMAL LOW (ref >60)
Glucose, Bld: 117 mg/dL — ABNORMAL HIGH (ref 70–99)
Potassium: 4.3 mmol/L (ref 3.5–5.1)
SODIUM: 150 mmol/L — AB (ref 135–145)

## 2018-10-23 LAB — CBC
HCT: 41.6 % (ref 36.0–46.0)
Hemoglobin: 12.4 g/dL (ref 12.0–15.0)
MCH: 31 pg (ref 26.0–34.0)
MCHC: 29.8 g/dL — ABNORMAL LOW (ref 30.0–36.0)
MCV: 104 fL — ABNORMAL HIGH (ref 80.0–100.0)
Platelets: 175 10*3/uL (ref 150–400)
RBC: 4 MIL/uL (ref 3.87–5.11)
RDW: 15.8 % — AB (ref 11.5–15.5)
WBC: 16 10*3/uL — ABNORMAL HIGH (ref 4.0–10.5)
nRBC: 0 % (ref 0.0–0.2)

## 2018-10-23 LAB — CULTURE, BLOOD (ROUTINE X 2)
Culture: NO GROWTH
Culture: NO GROWTH
Special Requests: ADEQUATE

## 2018-10-23 MED ORDER — SODIUM CHLORIDE 0.9 % IV SOLN
3.0000 g | Freq: Three times a day (TID) | INTRAVENOUS | Status: DC
  Administered 2018-10-23 – 2018-10-24 (×2): 3 g via INTRAVENOUS
  Filled 2018-10-23 (×3): qty 3

## 2018-10-23 MED ORDER — DEXTROSE 5 % IV SOLN
INTRAVENOUS | Status: DC
  Administered 2018-10-23 – 2018-10-24 (×2): via INTRAVENOUS

## 2018-10-23 MED ORDER — METOCLOPRAMIDE HCL 5 MG/ML IJ SOLN
5.0000 mg | Freq: Three times a day (TID) | INTRAMUSCULAR | Status: DC
  Administered 2018-10-23 – 2018-10-24 (×4): 5 mg via INTRAVENOUS
  Filled 2018-10-23 (×4): qty 2

## 2018-10-23 NOTE — Progress Notes (Signed)
Daily Progress Note   Patient Name: Emali Heyward       Date: 10/23/2018 DOB: June 11, 1931  Age: 84 y.o. MRN#: [id number] Attending Physician: Bonnielee Haff, MD Primary Care Physician: Nolene Ebbs, MD Admit Date: 10/10/2018  Reason for Consultation/Follow-up: Establishing goals of care  Subjective:  patient is restless, appears short of breath. She is more confused She states that she is "no good"  O2 saturations are low and she appears with moderate generalized distress Call placed and discussed with son, asked him to come to the hospital, he arrived, family meeting held with [facility] MD Dr. Maryland Pink, see below:   Length of Stay: 5  Current Medications: Scheduled Meds:  . dexamethasone  4 mg Intravenous Q12H  . glycopyrrolate  0.4 mg Intravenous QID  . letrozole  2.5 mg Oral Daily  . lidocaine  1 patch Transdermal Q24H  . metoCLOPramide (REGLAN) injection  5 mg Intravenous Q8H  . oxymetazoline  3 spray Each Nare Q6H  . pantoprazole (PROTONIX) IV  40 mg Intravenous Q24H  . sodium chloride  2 spray Each Nare TID    Continuous Infusions: . cefTRIAXone (ROCEPHIN)  IV Stopped (10/22/18 2236)  . dextrose 50 mL/hr at 10/23/18 1341    PRN Meds: acetaminophen, morphine injection, nitroGLYCERIN, ondansetron (ZOFRAN) IV, phenol, promethazine  Physical Exam         Frail weak lady Both legs are swollen She has coarse breath sounds S 1 S 2  Awakens but is more confused today Abdomen is not distended  Vital Signs: BP (!) 132/95 (BP Location: Left Arm)   Pulse (!) 56   Temp 97.8 F (36.6 C) (Axillary)   Resp 16   Ht 5' 5"  (1.651 m)   Wt 81.7 kg   SpO2 91%   BMI 29.97 kg/m  SpO2: SpO2: 91 % O2 Device: O2 Device: Nasal Cannula O2 Flow Rate: O2 Flow Rate (L/min): 3.5  L/min  Intake/output summary:   Intake/Output Summary (Last 24 hours) at 10/23/2018 1600 Last data filed at 10/23/2018 1500 Gross per 24 hour  Intake 1171.29 ml  Output 1275 ml  Net -103.71 ml   LBM: Last BM Date: (unknown) Baseline Weight: Weight: 81.7 kg Most recent weight: Weight: 81.7 kg PPS 20%      Palliative Assessment/Data:      Patient Active Problem  List   Diagnosis Date Noted  . Intractable vomiting   . Goals of care, counseling/discussion   . Palliative care encounter   . Abdominal pain 10/18/2018  . Chest pain 10/18/2018  . Carcinomatosis peritonei ([place]) 10/18/2018  . Leg edema 10/18/2018  . Sepsis ([place]) 10/18/2018  . Lower urinary tract infectious disease   . Elevated troponin 11/02/2018  . UTI (urinary tract infection) 10/16/2018  . Acute renal failure superimposed on stage 3 chronic kidney disease ([place]) 10/12/2018  . Hypercalcemia 10/11/2018  . Ovarian cancer, left ([place]) 08/25/2018  . Chronic pain of left knee 03/17/2017  . Chronic pain of right knee 03/17/2017    Palliative Care Assessment & Plan   Patient Profile:    ovarian cancer, peritoneal carcinomatosis, retroperitoneal lymphadenopathy.  She is from [place] Acute DVT Both legs swollen Restlessness, ?terminal agitation Shortness of breath Generalized distress AKI Possible asp pna Ongoing epistaxis, coffee ground emesis, anti coagulation on hold.    Recommendations/Plan:  Family meeting:  The patient was noted to be in distress, call placed, discussed with son, asked him to come to the hospital. The patient's son Zipporah Plants arrived. I met with him, in the patient's room, along with Dr Maryland Pink from [facility]. We reviewed the patient's current condition, her serious incurable illness.   The patient's son states that the patient had a good weekend, she enjoyed a visit from her community members, as per him, she is not having any more vomiting. He believes that her cancer is minimal, he believes  that she is not going to die from this cancer. If her heart stops or if she is not able to support the work of her breathing, he elects continuation of full code, full scope. He wants her to under CPR, ACLS protocol and also be on a ventilator if necessary.   Dr Maryland Pink and I have both tried to have conversations with him about the patient not benefiting from a resuscitative attempt. If it came to that, she would most benefit from DNR DNI and full scope of comfort measures.   The patient's son states that he was on the ventilator, some time ago, his family members were told that he wouldn't survive. He states, "you are not psychic, you don't know what's going to happen."  We discussed with him several times, about considering DNR DNI and comfort measures, patient's son at this time, repeatedly states, "she's not dying, she's not going to die."  Debriefed with bedside RN as well as Dr Maryland Pink after our meeting, agree with [facility] consult and await their recommendations and input. PMT to continue to follow.     Code Status:    Code Status Orders  (From admission, onward)         Start     Ordered   10/18/18 0120  Full code  Continuous     10/18/18 0121        Code Status History    This patient has a current code status but no historical code status.       Prognosis:   guarded, could be hours to days.   Discharge Planning:  Patient might not survive this hospitalization, high risk for ongoing decline, decompensation and death.   Care plan was discussed with patient, son, RN, Dr Maryland Pink  Thank you for allowing the Palliative Medicine Team to assist in the care of this patient.   Time In: 1500 Time Out: 1605 Total Time 65 Prolonged Time Billed  yes  Greater than 50%  of this time was spent counseling and coordinating care related to the above assessment and plan.  Loistine Chance, MD [phone] Please contact Palliative Medicine Team phone at [phone] for questions and  concerns.

## 2018-10-23 NOTE — Progress Notes (Signed)
I did check on the patient about 7:10 PM on 10/23/2018  Feels about the same Nurse reports, she is less agitated  Formal consult in a.m.

## 2018-10-23 NOTE — Progress Notes (Signed)
Pt had one episode of small coffee ground emesis while being turned for bath this morning. Pt promptly sat up and mouth suctioned and swabbed. Will continue to monitor.

## 2018-10-23 NOTE — Progress Notes (Signed)
Pt O2 stats continue to stay 83-86% despite turning O2 up to 6L. Pt with bloody emesis x2 this evening. High flow nasal cannula at 10L applied.

## 2018-10-23 NOTE — Progress Notes (Signed)
PT Cancellation Note  Patient Details Name: Christina [name] MRN: [id number] DOB: 1931-02-16   Cancelled Treatment:    Reason Eval/Treat Not Completed: Medical issues which prohibited therapy. Spoke with palliative MD who recommended we defer PT eval on today. Will continue to check back.    Weston Anna, PT Acute Rehabilitation Services Pager: [phone] Office: [phone]

## 2018-10-23 NOTE — Progress Notes (Signed)
Pharmacy Antibiotic Note  Christina [name] is a 84 y.o. female admitted on 10/16/2018 with abdominal pain, chest pain, generalized weakness. Pharmacy consulted today for Unasyn dosing for aspiration pneumonia.  Plan: Unasyn 3g IV q8h. Monitor renal function, cultures, clinical course.   Height: 5\' 5"  (165.1 cm) Weight: 180 lb 1.6 oz (81.7 kg) IBW/kg (Calculated) : 57  Temp (24hrs), Avg:98.3 F (36.8 C), Min:97.8 F (36.6 C), Max:99.3 F (37.4 C)  Recent Labs  Lab 10/19/2018 1514 10/27/2018 1808 10/18/18 0500 10/19/18 0454 10/20/18 0330 10/20/18 1152 10/21/18 0423 10/22/18 0735 10/22/18 1018 10/23/18 0435  WBC  --   --   --  13.8* 16.7* 17.4* 15.8* 20.8*  --  16.0*  CREATININE  --   --   --  1.89* 1.74*  --  1.53*  --  1.48* 1.39*  LATICACIDVEN 3.86* 3.35* 2.7*  --   --   --   --   --   --   --     Estimated Creatinine Clearance: 31.3 mL/min (A) (by C-G formula based on SCr of 1.39 mg/dL (H)).    No Known Allergies  Antimicrobials this admission: 12/10 Ceftriaxone >> 12/16 12/16 Unasyn >>   Dose adjustments this admission: --  Microbiology results: 12/11 BCx: NGF 12/10 UCx: NGF   Thank you for allowing pharmacy to be a part of this patient's care.   Lindell Spar, PharmD, BCPS Pager: [phone] 10/23/2018 5:22 PM

## 2018-10-23 NOTE — Progress Notes (Addendum)
[facility] PROGRESS NOTE  Christina [name] PZW:[id number] DOB: [birth year] DOA: 11/07/2018  PCP: Nolene Ebbs, MD  Brief History/Interval Summary: 84 y.o. female with medical history significant of ovarian cancer, depression, leg edema, who presented with abdominal pain, chest pain.  Patient speaks limited [place].  She is originally from [place]. Pt has bilateral leg edema, she was seen in ED on 08/31/2018, and started with Lasix. Per report, pt recently refused hospice care. CT head showed possible focal infection, but MRI is negative for acute stroke and it showed old infarction.  CT abdomen/pelvis that showed peritoneal carcinomatosis.  Patient is admitted to telemetry bed as inpatient.   Reason for Visit: Abdominal pain due to peritoneal carcinomatosis.  Urinary tract infection.  Acute bilateral lower extremity DVT  Consultants: Palliative medicine  Procedures:   Lower extremity venous Doppler Positive Findings consistent with Acute Deep Vein Thrombosis involving the right common femoral vein, right femoral vein, right proximal profunda vein, and right popliteal vein, the left common femoral vein, left femoral vein, and left popliteal vein. Findings consistent with acute superficial vein thrombosis involving the right great saphenous vein.  Exam also attempted to approach bilateral external iliac veins. Left external iliac vein appears thrombosis. Right side unable to approach due to patient's refusal.  Incidental findings: Multiple prominent Hypochoic lesions seen at groin area bilaterally with non-vascularity. Etiology unknown  Transthoracic echocardiogram Study Conclusions  - Left ventricle: The cavity size was normal. Systolic function was   vigorous. The estimated ejection fraction was in the range of 65%   to 70%. Wall motion was normal; there were no regional wall   motion abnormalities. The study isindeterminate for the   evaluation of LV diastolic function. -  Pericardium, extracardiac: There was a pleural effusion.  Impressions:  - Significant arrhythmia, including ventricular bigeminy and   nonsustained VT, seen throughout study. Normal LV EF, no   significant valve disease. Technically difficult study due to   patient factors, no subcostal or suprasternal notch windows.  Antibiotics: Ceftriaxone  Subjective/Interval History: According to nursing staff patient had a few episodes of coffee-ground emesis but small-volume.  Overall she was better yesterday than the day before.  Patient remains lethargic.  Objective:  Vital Signs  Vitals:   10/22/18 0559 10/22/18 1340 10/22/18 2058 10/23/18 0537  BP: (!) 139/54 (!) 135/52 (!) 133/55 (!) 128/57  Pulse: (!) 53 (!) 56 (!) 50 (!) 49  Resp: (!) _0 Temp: 98.5 F (36.9 C) 98.1 F (36.7 C) 97.9 F (36.6 C) 99.3 F (37.4 C)  TempSrc: Oral Oral Oral Oral  SpO2: 93% 91% 92% 94%  Weight:      Height:        Intake/Output Summary (Last 24 hours) at 10/23/2018 0925 Last data filed at 10/23/2018 0603 Gross per 24 hour  Intake 724.79 ml  Output 925 ml  Net -200.21 ml   Filed Weights   10/18/18 1714  Weight: 81.7 kg    General appearance: Lethargic but easily arousable.  In no distress. Resp: Coarse breath sounds bilaterally.  Crackles at the right base.  No wheezing or rhonchi. Cardio: S1-S2 is normal regular.  No S3-S4.  No rubs murmurs or bruit GI: Abdomen is soft.  Nondistended.  Mildly tender in the lower abdomen.  Bowel sounds sluggish. Extremities: Bilateral lower extremity edema Neurologic: She is awake alert.  No obvious focal neurological deficits noted.  Lab Results:  Data Reviewed: I have personally reviewed following labs and imaging studies  CBC: Recent Labs  Lab 10/20/18 0330 10/20/18 1152 10/21/18 0423 10/21/18 1209 10/22/18 0735 10/23/18 0435  WBC 16.7* 17.4* 15.8*  --  20.8* 16.0*  HGB 13.5 13.0 12.2 12.2 13.2 12.4  HCT 45.3 42.1 39.9 39.8  44.3 41.6  MCV 103.7* 99.1 101.0*  --  103.7* 104.0*  PLT 117* 181 186  --  200 794    Basic Metabolic Panel: Recent Labs  Lab 10/18/18 1613 10/19/18 0454 10/20/18 0330 10/21/18 0423 10/22/18 1018 10/23/18 0435  NA  --  143 144 143 146* 150*  K  --  3.5 4.8 4.3 4.0 4.3  CL  --  98 102 107 111 116*  CO2  --  _0 GLUCOSE  --  91 109* 137* 114* 117*  BUN  --  102* 98* 106* 99* 92*  CREATININE  --  1.89* 1.74* 1.53* 1.48* 1.39*  CALCIUM  --  10.3 10.1 9.7 9.4 9.4  MG 2.4  --   --   --   --   --     GFR: Estimated Creatinine Clearance: 31.3 mL/min (A) (by C-G formula based on SCr of 1.39 mg/dL (H)).  Liver Function Tests: Recent Labs  Lab 10/18/2018 1509 10/21/18 0423  AST 35 21  ALT 18 13  ALKPHOS 100 73  BILITOT 1.7* 0.9  PROT 6.9 5.7*  ALBUMIN 2.8* 2.9*    Recent Labs  Lab 10/18/18 0500  LIPASE 25   Coagulation Profile: Recent Labs  Lab 10/18/18 0500  INR 1.43    Cardiac Enzymes: Recent Labs  Lab 11/04/2018 1509 10/18/18 0500 10/18/18 0820 10/18/18 1202  TROPONINI 0.05* 0.05* 0.04* 0.04*     Recent Results (from the past 240 hour(s))  Urine culture     Status: None   Collection Time: 10/29/2018  3:22 PM  Result Value Ref Range Status   Specimen Description   Final    URINE, RANDOM Performed at [facility] [address], [place], [place] [address]    Special Requests   Final    NONE Performed at [facility], [place] [address], [place], [place] [address]    Culture   Final    NO GROWTH Performed at [facility], [place] [address], [place], [place] [address]    Report Status 10/19/2018 FINAL  Final  Culture, blood (x 2)     Status: None   Collection Time: 10/18/18  8:20 AM  Result Value Ref Range Status   Specimen Description   Final    BLOOD LEFT ARM Performed at [facility] [address] [place], [place] [address]    Special Requests   Final    BOTTLES DRAWN  AEROBIC AND ANAEROBIC Blood Culture adequate volume   Culture   Final    NO GROWTH 5 DAYS Performed at [facility], [place] [address] [place], [place] [address]    Report Status 10/23/2018 FINAL  Final  Culture, blood (x 2)     Status: None   Collection Time: 10/18/18 12:02 PM  Result Value Ref Range Status   Specimen Description   Final    BLOOD LEFT HAND Performed at [facility] [address], [place],  [address]    Special Requests   Final    BOTTLES DRAWN AEROBIC ONLY Blood Culture results may not be optimal due to an inadequate volume of blood received in culture bottles Performed at [facility], [place] [address], [place], [place] [address]    Culture   Final    NO GROWTH 5 DAYS Performed at [facility], [place] [address], [place], [place] [address]    Report Status 10/23/2018 FINAL  Final      Radiology Studies: Dg Abd Portable 1v  Result Date: 10/21/2018 CLINICAL DATA:  Coffee ground emesis.  History of ovarian cancer. EXAM: PORTABLE ABDOMEN - 1 VIEW COMPARISON:  None. FINDINGS: The bowel gas pattern is normal. No radio-opaque calculi or other significant radiographic abnormality are seen. IMPRESSION: Negative. Electronically Signed   By: Dorise Bullion III M.D   On: 10/21/2018 15:05     Medications:  Scheduled: . dexamethasone  4 mg Intravenous Q12H  . glycopyrrolate  0.4 mg Intravenous QID  . letrozole  2.5 mg Oral Daily  . lidocaine  1 patch Transdermal Q24H  . metoCLOPramide (REGLAN) injection  5 mg Intravenous Q8H  . oxymetazoline  3 spray Each Nare Q6H  . pantoprazole (PROTONIX) IV  40 mg Intravenous Q24H  . sodium chloride  2 spray Each Nare TID   Continuous: . sodium chloride 50 mL/hr at 10/23/18 0600  . cefTRIAXone (ROCEPHIN)  IV Stopped (10/22/18 2236)   DHR:CBULAGTXMIWOE, morphine injection, nitroGLYCERIN, ondansetron (ZOFRAN) IV, phenol,  promethazine    Assessment/Plan:  Epistaxis/coffee-ground emesis Likely brought on by anticoagulation.  She was getting oxygen as well.  Initially it was unclear as she did not have any anterior bleeding.  Patient was prescribed Afrin nasal spray.  She has had a few recurrence of her bleeding however her hemoglobin has been surprisingly stable. She was discussed with the ENT as well who recommended Afrin nasal spray but no packing at this time.  Continue to monitor for now.  Continue PPI.  CT scan did show a patulous esophagus.  We will give her Reglan to see if that can increase her GI motility.  Possible aspiration pneumonia Chest x-ray suggested aspiration.  Continue ceftriaxone for now.  WBC is stable.  Steroids likely contributing to elevated WBC.  She is afebrile.    Urinary tract infection with sepsis Patient met criteria for sepsis at admission with leukocytosis tachypnea and elevated lactic acid level.  UA was noted to be abnormal.  Patient was started on ceftriaxone.  Urine culture without any growth.  Blood cultures without any growth so far.  Lactic acid level was initially elevated and improved to 2.7.  Procalcitonin 0.36.  Continue ceftriaxone for now.  Acute renal failure on chronic kidney disease stage III Baseline creatinine around 1.6.  Creatinine elevated at 2.4 at admission.  Patient was given IV fluids.  Lasix on hold.  Creatinine has improved to baseline.  BUN is improving slowly.  Continue gentle IV hydration.  No hydronephrosis was noted on CT scan.  Monitor urine output.    Acute bilateral lower extremity DVT Patient noted to have significant edema of lower extremities.  Doppler study positive for DVT.  Most likely due to her cancer.  Patient was initially started on IV heparin.  However significant drop in her platelet counts noted on the morning of 12/12.  Discussed with Dr. Lindi Adie who recommended changing to Xarelto or apixaban.  Patient started on apixaban.  However  she developed epistaxis and so we had to hold the apixaban.  Her platelet counts have improved and are now normal.  Continue to hold apixaban for now.  Will reevaluate situation tomorrow.  Thrombocytopenia Now resolved.  Significant drop in platelet  counts was noted on 12/12.  Heparin was discontinued.  Platelet count is now normal. Etiology likely multifactorial including cancer, consumption, possible heparin-induced thrombocytopenia.    History of ovarian cancer with peritoneal carcinomatosis and retroperitoneal lymphadenopathy Patient is on letrozole which is being continued.  Patient was seen by Dr. Lindi Adie with oncology last month.  He had recommended hospice as the patient is not a candidate for any aggressive interventions and he feels that the cancer will just continue to get worse.  Discussed with Dr. Lindi Adie on 12/13 and he recommended the same.  He recommended a palliative medicine consult as patient's son appears to be resisting any talks of hospice.  Patient seen by palliative medicine.  They also communicate difficulty with having this conversation with patient's son as he seems to have unrealistic expectation.  They will continue to work with this patient and her son.  Continue dexamethasone.    Elevated troponin and chest pain Thought to be secondary to demand ischemia from sepsis.  Troponin levels have been flat.  Patient was started on aspirin and statin.  LDL 136.  Cardiology was consulted.  In view of her other comorbidities she is not a candidate for further cardiac testing.  HbA1c 5.1.  EKG showed ventricular bigeminy.  Aspirin was discontinued.  Echocardiogram shows normal systolic function without any wall motion abnormalities.  PVCs noted.  Telemetry continues to show bigeminy and PVCs.  Stable for the most part.  Acute metabolic encephalopathy Most likely due to all of the above.  CT scan did raise concern for stroke.  MRI however did not show any acute stroke.  Old infarcts were  noted.  Abdominal pain Most likely due to ovarian cancer and peritoneal carcinomatosis.  Abdominal film done yesterday did not show anything concerning.  She could have a low level obstruction versus ileus which could be causing her to have nausea and vomiting.  Low-dose Reglan to be tried for now.  Hypercalcemia Calcium was mildly elevated at admission at 10.9.this was most likely due to dehydration.  Improved subsequently.    Hypernatremia Most likely due to free water deficit.  Change IV fluids to D5 water.  ADDENDUM: Had family meeting with patient's son along with Dr. Rowe Pavy from palliative medicine.  Patient son refuses to acknowledge that the patient is getting worse.  He states that if her breathing were to get worse he would like her to be on the "breathing machine".  Despite our attempts to tell him that she has metastatic cancer, extensive bilateral DVTs and has not been successful with her treatment with anticoagulation due to bleeding issues he still thinks that she is can improve.  Patient noted to be a more hypoxic this afternoon compared to this morning.  Oxygen increased to 5 L/min.  We will get a stat chest x-ray.  In my opinion considering patient's comorbidities and terminal illness, heroic measures are not warranted.  And any such measures will be futile.  Discussed with the patient's medical oncologist Dr. Lindi Adie.  He will come and evaluate the patient.  Have also requested consultation from critical care medicine.  If patient continues to decompensate we will have a low threshold to move her to stepdown unit.  DVT Prophylaxis: Apixaban placed on hold due to bleeding issues as discussed above. Code Status: Full code Family Communication: No family at bedside Disposition Plan: Management as outlined above.  Prognosis remains poor.  Palliative medicine following.  Waiting for her nausea vomiting to subside but if it is  indeed due to her abdominal peritoneal carcinomatosis then this  may not improve.    LOS: 5 days   [facility] Pager [phone] 10/23/2018, 9:25 AM  If 7PM-7AM, please contact night-coverage at [email or web address], password [email or web address]

## 2018-10-23 NOTE — Care Management Important Message (Signed)
Important Message  Patient Details  Name: Adysson Revelle MRN: [id number] Date of Birth: June 29, 1931   Medicare Important Message Given:  Yes    Kerin Salen 10/23/2018, 2:52 [place] Message  Patient Details  Name: Alizia Greif MRN: [id number] Date of Birth: [birth year]   Medicare Important Message Given:  Yes    Kerin Salen 10/23/2018, 2:51 PM

## 2018-10-23 NOTE — Progress Notes (Signed)
HEMATOLOGY-ONCOLOGY PROGRESS NOTE  SUBJECTIVE: Patient is continuously having hematemesis in spite of multiple antinausea medications and Protonix IV. She is still in clear mind and able to answer questions appropriately. She feels very hungry and wants to eat food. Her son was in the room and was continuously interrupting our interpreter.  We had to send the son out of the room so that we can discuss all issues with the patient. Patient does not want to continue aggressive care and wants to go on hospice.  She wants to go to inpatient hospice facility.  Her son has severe arthritis and is using a wheelchair for ambulation.  He is in no way able to help take care of her.  OBJECTIVE:   Ovarian cancer, left ([place])   08/18/2018 Initial Diagnosis    Right axillary lymph node biopsy: High-grade carcinoma.  CK7, PAX 8, WT 1, ER and GA TA-3 are positive, suggestive of gynecological primary, ER 40% week, PR 0%, HER-2 2+ equal vocal, FISH positive ratio 2.22, average copy #5.1    08/25/2018 -  Anti-estrogen oral therapy    Letrozole 2.5 mg daily     REVIEW OF SYSTEMS:   Constitutional: Extremely frail lady in moderate distress because of hematemesis Complains of feeling nauseated but also hungry at the same time. She is of sound mind and able to understand my questions and answer questions appropriately. I believe she has decision-making capacity.  PHYSICAL EXAMINATION: ECOG PERFORMANCE STATUS: 4 - Bedbound  Vitals:   10/23/18 1500 10/23/18 1754  BP: (!) 132/95   Pulse: (!) 56   Resp: 16   Temp: 97.8 F (36.6 C)   SpO2: 91% (!) 89%   Filed Weights   10/18/18 1714  Weight: 180 lb 1.6 oz (81.7 kg)    GENERAL: Frail lady in distress because of GI bleeding SKIN: Pallor EYES: Pallor OROPHARYNX: Hematemesis NECK: supple, thyroid normal size, non-tender, without nodularity NEURO: alert & oriented x 3 with fluent speech, no focal motor/sensory deficits Ext: Bilateral lower extremity  edema which appears to have subsided with Lasix  LABORATORY DATA:  I have reviewed the data as listed CMP Latest Ref Rng & Units 10/23/2018 10/22/2018 10/21/2018  Glucose 70 - 99 mg/dL 117(H) 114(H) 137(H)  BUN 8 - 23 mg/dL 92(H) 99(H) 106(H)  Creatinine 0.44 - 1.00 mg/dL 1.39(H) 1.48(H) 1.53(H)  Sodium 135 - 145 mmol/L 150(H) 146(H) 143  Potassium 3.5 - 5.1 mmol/L 4.3 4.0 4.3  Chloride 98 - 111 mmol/L 116(H) 111 107  CO2 22 - 32 mmol/L _0 Calcium 8.9 - 10.3 mg/dL 9.4 9.4 9.7  Total Protein 6.5 - 8.1 g/dL - - 5.7(L)  Total Bilirubin 0.3 - 1.2 mg/dL - - 0.9  Alkaline Phos 38 - 126 U/L - - 73  AST 15 - 41 U/L - - 21  ALT 0 - 44 U/L - - 13    Lab Results  Component Value Date   WBC 16.0 (H) 10/23/2018   HGB 12.4 10/23/2018   HCT 41.6 10/23/2018   MCV 104.0 (H) 10/23/2018   PLT 175 10/23/2018   NEUTROABS 8.9 (H) 10/11/2018    ASSESSMENT AND PLAN: 1. Palpable axillary masses, work-up revealed bilateral inguinal lymph nodes, diffuse abdominal pelvic adenopathy 3.7 cm left inguinal node, porta hepatis lymph node 5.7 cm, periaortic diffuse adenopathy including left ovarian adnexal mass. Biopsy right axilla: High-grade carcinoma possible gynecological primary ER 40% week, PR 0%, HER-2 equivocal 2+ FISH positive ratio 2.22, copy #5.1 Patient is  too frail to receive any therapy. She could not even tolerate letrozole therapy. I agree with the treatment care team that she needs hospice care. She cannot be taking care of at home because her son is not physically able to care for her.  Because her son was disrupted during the interview he had to be moved out of the room so that we can interpret and discuss the care with her. Patient has decision-making capacity. She wants to go to hospice care at an inpatient facility.  We will contact [facility] tomorrow to see if they have bed availability.  2. DNRCC: Through the interpreter we explained what CODE STATUS means and patient is  agreeable to DNR CC.

## 2018-10-23 NOTE — Plan of Care (Signed)
  Problem: Safety: Goal: Ability to remain free from injury will improve Outcome: Progressing   Problem: Skin Integrity: Goal: Risk for impaired skin integrity will decrease Outcome: Progressing   

## 2018-10-24 LAB — CBC
HCT: 45.8 % (ref 36.0–46.0)
Hemoglobin: 13.3 g/dL (ref 12.0–15.0)
MCH: 30.8 pg (ref 26.0–34.0)
MCHC: 29 g/dL — ABNORMAL LOW (ref 30.0–36.0)
MCV: 106 fL — ABNORMAL HIGH (ref 80.0–100.0)
PLATELETS: 221 10*3/uL (ref 150–400)
RBC: 4.32 MIL/uL (ref 3.87–5.11)
RDW: 16.5 % — AB (ref 11.5–15.5)
WBC: 18.9 10*3/uL — ABNORMAL HIGH (ref 4.0–10.5)
nRBC: 1 % — ABNORMAL HIGH (ref 0.0–0.2)

## 2018-10-24 LAB — BASIC METABOLIC PANEL
Anion gap: 15 (ref 5–15)
BUN: 107 mg/dL — ABNORMAL HIGH (ref 8–23)
CO2: 17 mmol/L — ABNORMAL LOW (ref 22–32)
Calcium: 9.6 mg/dL (ref 8.9–10.3)
Chloride: 116 mmol/L — ABNORMAL HIGH (ref 98–111)
Creatinine, Ser: 1.91 mg/dL — ABNORMAL HIGH (ref 0.44–1.00)
GFR calc Af Amer: 27 mL/min — ABNORMAL LOW (ref >60)
GFR calc non Af Amer: 23 mL/min — ABNORMAL LOW (ref >60)
Glucose, Bld: 175 mg/dL — ABNORMAL HIGH (ref 70–99)
Potassium: 4.8 mmol/L (ref 3.5–5.1)
SODIUM: 148 mmol/L — AB (ref 135–145)

## 2018-10-24 MED ORDER — SODIUM CHLORIDE 0.9 % IV SOLN
3.0000 g | Freq: Two times a day (BID) | INTRAVENOUS | Status: DC
  Administered 2018-10-24: 3 g via INTRAVENOUS
  Filled 2018-10-24 (×2): qty 3

## 2018-11-02 ENCOUNTER — Ambulatory Visit: Payer: Self-pay | Admitting: Hematology and Oncology

## 2018-11-08 NOTE — Death Summary Note (Signed)
DEATH SUMMARY   Patient Details  Name: Christina [name] MRN: [id number] DOB: 08-19-1931  Admission/Discharge Information   Admit Date:  November 16, 2018  Date of Death:   11-23-2018  Time of Death:  3:24PM  Length of Stay: 6  Referring Physician: Nolene Ebbs, MD   Reason(s) for Hospitalization  Abdominal pain due to peritoneal carcinomatosis  Diagnoses  Cause of death: Metastatic ovarian cancer  Secondary Diagnoses (including complications and co-morbidities):  stage 3 chronic kidney disease ([place])   Hypercalcemia Lower extremity DVT Aspiration pneumonia  Brief Hospital Course (including significant findings, care, treatment, and services provided and events leading to death)   Brief History/Interval Summary: 85 y.o.femalewith medical history significant of ovarian cancer, depression, leg edema, who presented with abdominal pain, chest pain.  Patient speaks limited [place].  She is originally from [place]. Pt has bilateral leg edema, she was seen in ED on 08/31/2018, and started with Lasix. Per report, pt recently refused hospice care. CT head showed possible focal infection, but MRI is negative for acute stroke and it showed old infarction. CT abdomen/pelvis that showed peritoneal carcinomatosis. Patient is admitted to telemetry bed as inpatient.  Despite antibiotics patient did not improve.  She continued to get worse.  She was found to have lower extremity DVT.  She was started on anticoagulation however she had bleeding complications.  Palliative medicine was consulted.   Consultants: Palliative medicine.  Medical oncology  Procedures:   Lower extremity venous Doppler PositiveFindingsconsistent with AcuteDeepVeinThrombosisinvolving the right common femoral vein, right femoral vein, right proximal profunda vein, and right popliteal vein,the left common femoral vein, left femoral vein, and left popliteal vein. Findings consistent with acute superficial vein thrombosis  involving the right great saphenous vein. Exam also attempted to approach bilateral external iliac veins. Left external iliac vein appears thrombosis. Right side unable to approach due to patient's refusal.  Incidental findings:Multiple prominent Hypochoic lesions seen at groin areabilaterallywith non-vascularity. Etiology unknown  Transthoracic echocardiogram Study Conclusions  - Left ventricle: The cavity size was normal. Systolic function was vigorous. The estimated ejection fraction was in the range of 65% to 70%. Wall motion was normal; there were no regional wall motion abnormalities. The study isindeterminate for the evaluation of LV diastolic function. - Pericardium, extracardiac: There was a pleural effusion.  Impressions:  - Significant arrhythmia, including ventricular bigeminy and nonsustained VT, seen throughout study. Normal LV EF, no significant valve disease. Technically difficult study due to patient factors, no subcostal or suprasternal notch windows.   Hospital course:  Goals of care Despite multiple attempts by self, palliative medicine patient's son was not amenable to have a conversation regarding goals of care.  He continued to mention that patient will get better.  Requested oncology to to come and see the patient.  Appreciate Dr. Geralyn Flash assistance.  He also noted that the patient's son was impeding conversation with patient.  He was able to converse with patient using interpreter and her wishes were to be made comfortable.  She wanted to go to hospice.    CODE STATUS was changed to DNR.  Referral sent to residential hospice.  However patient expired today at 1524 hrs.  Epistaxis/coffee-ground emesis Patient developed coffee-ground emesis and epistaxis when she was placed on oral anticoagulation.    Possible aspiration pneumonia Chest x-ray suggested aspiration.  Patient's antibiotics changed over to Unasyn.    Urinary tract  infection with sepsis Patient met criteria for sepsis at admission with leukocytosis tachypnea and elevated lactic acid level.  UA was noted  to be abnormal.  Patient was started on ceftriaxone.  Urine culture without any growth.  Blood cultures without any growth so far.  Lactic acid level was initially elevated and improved to 2.7.  Procalcitonin 0.36.  She was on ceftriaxone for a few days.  Acute renal failure on chronic kidney disease stage III Baseline creatinine around 1.6.  Creatinine elevated at 2.4 at admission.  Patient was given IV fluids.  Lasix on hold.  Creatinine improved to baseline.    Acute bilateral lower extremity DVT Patient noted to have significant edema of lower extremities.  Doppler study positive for DVT.  Most likely due to her cancer.  Patient was initially started on IV heparin.  However significant drop in her platelet counts noted on the morning of 12/12.  Discussed with Dr. Lindi Adie who recommended changing to Xarelto or apixaban.  Patient started on apixaban.  However she developed epistaxis and so we had to hold the apixaban.    Thrombocytopenia Significant drop in platelet counts was noted on 12/12.  Heparin was discontinued.  Platelet count is now normal. Etiology likely multifactorial including cancer, consumption, possible heparin-induced thrombocytopenia.    History of ovarian cancer with peritoneal carcinomatosis and retroperitoneal lymphadenopathy Patient is on letrozole which is being continued.  Patient was seen by Dr. Lindi Adie with oncology last month.  He had recommended hospice as the patient is not a candidate for any aggressive interventions and he feels that the cancer will just continue to get worse.  Discussed with Dr. Lindi Adie on 12/13 and he recommended the same.    Elevated troponin and chest pain Thought to be secondary to demand ischemia from sepsis.  Troponin levels have been flat.  Patient was started on aspirin and statin.  LDL 136.   Cardiology was consulted.  In view of her other comorbidities she is not a candidate for further cardiac testing.  HbA1c 5.1.  EKG showed ventricular bigeminy.  Aspirin was discontinued.  Echocardiogram shows normal systolic function without any wall motion abnormalities.   Acute metabolic encephalopathy Most likely due to all of the above.  CT scan did raise concern for stroke.  MRI however did not show any acute stroke.  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  Abdominal pain Most likely due to ovarian cancer and peritoneal carcinomatosis.  could be causing her to have nausea and vomiting.  Low-dose Reglan to be tried for now.  Pain control.  Hypercalcemia Calcium was mildly elevated at admission at 10.9.this was most likely due to dehydration.  Improved subsequently.    Hypernatremia Most likely due to free water deficit.    Patient expired on 11-10-2018 at 3:24 PM.     Pertinent Labs and Studies  Significant Diagnostic Studies Ct Abdomen Pelvis Wo Contrast  Result Date: 10/18/2018 CLINICAL DATA:  85 y/o F; increasing weakness, nausea, vomiting, abdominal pain, pitting edema, leukocytosis. History of ovarian cancer post chemotherapy. EXAM: CT ABDOMEN AND PELVIS WITHOUT CONTRAST TECHNIQUE: Multidetector CT imaging of the abdomen and pelvis was performed following the standard protocol without IV contrast. COMPARISON:  None. FINDINGS: Lower chest: Small right pleural effusion. Patulous fluid-filled esophagus. Hepatobiliary: Right lobe of liver small calcified granuloma. No additional focal liver lesion identified. Cholecystectomy. Pancreas: Unremarkable. No pancreatic ductal dilatation or surrounding inflammatory changes. Spleen: Normal in size without focal abnormality. Adrenals/Urinary Tract: Adrenal glands are unremarkable. Multiple renal cysts. Right kidney interpolar 12 mm stone. No hydronephrosis. Normal bladder. Stomach/Bowel: Stomach is within normal limits. Appendix not identified, no  pericecal inflammation. No evidence  of bowel wall thickening, distention, or inflammatory changes. Vascular/Lymphatic: Retroperitoneal and mesenteric lymphadenopathy. Multiple peritoneal masses within the omentum, mesentery, and lesser sac with the largest lesions as follows: Liver hilum 4.2 x 6.7 cm (series 2, image 23), splenic hilum 4.1 x 4.2 cm (series 2, image 24), right upper quadrant 2.7 x 2.1 cm (series 2, image 26), anterior omentum 2.4 x 4.1 cm (series 2, image 32), right lower quadrant 2.2 x 2.6 cm (series 2, image 54), pouch of Douglass 2.5 x 2.6 cm (series 2, image 66). Additionally, there is a mass implanted within the paraumbilical hernia measuring up to 3.2 cm. Small volume ascites. Abdominal aortic calcific atherosclerosis. Reproductive: Uterus and bilateral adnexa are unremarkable. Other: No abdominal wall hernia or abnormality. No abdominopelvic ascites. Musculoskeletal: No fracture is seen. IMPRESSION: 1. Multiple peritoneal masses, likely peritoneal carcinomatosis, with the largest mass measuring 6.7 cm at the liver hilum. 2. Retroperitoneal and mesenteric lymphadenopathy, probably metastatic. 3. Small right pleural effusion.  Small volume ascites. 4. Patulous fluid-filled esophagus. 5. Right kidney interpolar nonobstructing stone. 6. Aortic Atherosclerosis (ICD10-I70.0). Electronically Signed   By: Kristine Garbe M.D.   On: 10/18/2018 02:03   Dg Chest 2 View  Result Date: 11/06/2018 CLINICAL DATA:  Weakness.  Cancer patient. EXAM: CHEST - 2 VIEW COMPARISON:  10/11/2018 FINDINGS: Improved aeration of the lung bases, now clear. Resolved left effusion. Negative for heart failure. No new area of infiltrate or mass. IMPRESSION: No active cardiopulmonary disease. Electronically Signed   By: Franchot Gallo M.D.   On: 11/05/2018 16:11   Dg Chest 2 View  Result Date: 10/11/2018 CLINICAL DATA:  Increasing shortness of breath. Bilateral leg swelling. EXAM: CHEST - 2 VIEW COMPARISON:   09/03/2006 FINDINGS: Heart is upper limits normal in size. Mild peribronchial thickening. Small left effusion. No confluent opacities or overt edema. No acute bony abnormality. IMPRESSION: Mild bronchitic changes. Small left pleural effusion. Borderline heart size. Electronically Signed   By: Rolm Baptise M.D.   On: 10/11/2018 01:33   Ct Head Wo Contrast  Result Date: 10/31/2018 CLINICAL DATA:  Altered mental status. Questionable history of ovarian carcinoma EXAM: CT HEAD WITHOUT CONTRAST TECHNIQUE: Contiguous axial images were obtained from the base of the skull through the vertex without intravenous contrast. COMPARISON:  None. FINDINGS: Brain: There is mild diffuse atrophy. There is no intracranial mass, hemorrhage, extra-axial fluid collection, or midline shift. There is mild small vessel disease in the centra semiovale bilaterally. There is focal decreased attenuation to the right of the anterior horn of the right lateral ventricle, a finding which may represent a small recent/acute infarct. This finding is best seen on axial slice 19 series 2, sagittal slice 21 series 6, and coronal slice 25 series 5. No other findings suggesting potential acute infarct evident. Vascular: There is no appreciable hyperdense vessel. There is calcification in each carotid siphon region. Skull: The bony calvarium appears intact. Sinuses/Orbits: There is mild mucosal thickening in several ethmoid air cells. Other paranasal sinuses are clear. There is leftward deviation of the nasal septum. Orbits appear symmetric bilaterally except for previous cataract removal on the right. Other: Mastoid air cells are clear. IMPRESSION: 1. Decreased attenuation adjacent to the anterior horn the right lateral ventricle. Suspect recent and potentially acute focal infarct in this area of the inferior right frontal lobe. There is mild periventricular small vessel disease. No mass or hemorrhage evident. 2. There are foci of arterial vascular  calcification. There is mucosal thickening in several ethmoid air cells. There  is nasal septal deviation. Electronically Signed   By: Lowella Grip III M.D.   On: 10/30/2018 16:22   Mr Brain Wo Contrast  Result Date: 10/25/2018 CLINICAL DATA:  Nausea, vomiting and leg swelling. Follow-up suspected infarct. History of ovarian cancer. EXAM: MRI HEAD WITHOUT CONTRAST TECHNIQUE: Multiplanar, multiecho pulse sequences of the brain and surrounding structures were obtained without intravenous contrast. COMPARISON:  CT HEAD October 17, 2018 FINDINGS: INTRACRANIAL CONTENTS: No reduced diffusion to suggest acute ischemia or hypercellular tumor. No susceptibility artifact to suggest hemorrhage. The ventricles and sulci are normal for patient's age. Patchy supratentorial white matter FLAIR T2 hyperintensities compatible with mild chronic small vessel ischemic changes. Old small LEFT cerebellar infarct. Old LEFT basal ganglia infarct. Minimal RIGHT parietal encephalomalacia. No suspicious parenchymal signal, masses, mass effect. No abnormal extra-axial fluid collections. No extra-axial masses. VASCULAR: Normal major intracranial vascular flow voids present at skull base. SKULL AND UPPER CERVICAL SPINE: No abnormal sellar expansion. Heterogeneous calvarial bone marrow signal without diffusion abnormality to suggest metastasis. Craniocervical junction maintained. SINUSES/ORBITS: The mastoid air-cells and included paranasal sinuses are well-aerated.The included ocular globes and orbital contents are non-suspicious. Status post RIGHT ocular lens implant. Status post bilateral ocular lens implants. OTHER: Patient is edentulous. IMPRESSION: 1. No acute intracranial process. 2. 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 Old small RIGHT parietal/MCA territory infarct versus TBI. 3. Mild chronic small vessel ischemic changes. Electronically Signed   By: Elon Alas M.D.   On: 10/31/2018 19:22   Dg Chest  Port 1 View  Result Date: 10/23/2018 CLINICAL DATA:  Shortness of breath and hypoxia EXAM: PORTABLE CHEST 1 VIEW COMPARISON:  10/20/2018 FINDINGS: Shallow lung inflation with markedly worsened aeration of the right lung. Small right pleural effusion. Mild cardiomegaly is unchanged. IMPRESSION: Shallow lung inflation with markedly worsened aeration of the right lung, which could indicate asymmetric pulmonary edema or developing pneumonia. Small right pleural effusion. Electronically Signed   By: Ulyses Jarred M.D.   On: 10/23/2018 16:33   Dg Chest Port 1 View  Result Date: 10/20/2018 CLINICAL DATA:  Dyspnea EXAM: PORTABLE CHEST 1 VIEW COMPARISON:  October 17, 2017 FINDINGS: The heart, hila, and mediastinum are unchanged. Increased infiltrate in left base with obscuration left hemidiaphragm. There may be a small associated effusion. Mild increased interstitial opacity throughout the right lung, more focal in the right base. No other interval changes. IMPRESSION: 1. New infiltrate in the left base with probable small associated effusion. 2. New increased interstitial opacity in the right lung, little more focal in the right base may represent asymmetric edema or an infectious process. Electronically Signed   By: Dorise Bullion III M.D   On: 10/20/2018 09:06   Dg Abd Portable 1v  Result Date: 10/21/2018 CLINICAL DATA:  Coffee ground emesis.  History of ovarian cancer. EXAM: PORTABLE ABDOMEN - 1 VIEW COMPARISON:  None. FINDINGS: The bowel gas pattern is normal. No radio-opaque calculi or other significant radiographic abnormality are seen. IMPRESSION: Negative. Electronically Signed   By: Dorise Bullion III M.D   On: 10/21/2018 15:05   Vas [place] Lower Extremity Venous (dvt)  Result Date: 10/18/2018  Lower Venous Study Indications: Pain, Swelling, Edema, SOB, and Chest pain, abdominal pain.  Risk Factors: Cancer Ovarian Cancer. Limitations: Body habitus and Pain,discomfort and severe pitting edema,  immobility. Performing Technologist: Rudell Cobb  Examination Guidelines: A complete evaluation includes B-mode imaging, spectral Doppler, color Doppler, and power Doppler as needed of all accessible portions of each vessel. Bilateral  testing is considered an integral part of a complete examination. Limited examinations for reoccurring indications may be performed as noted.  Right Venous Findings: +---------+---------------+---------+-----------+---------------+--------------+          CompressibilityPhasicitySpontaneityProperties     Summary        +---------+---------------+---------+-----------+---------------+--------------+ CFV      None           Yes      Yes        partially      Acute                                                      re-cannalized                 +---------+---------------+---------+-----------+---------------+--------------+ SFJ      None                                              Acute          +---------+---------------+---------+-----------+---------------+--------------+ FV Prox  None                               partially      Acute                                                      re-cannalized                 +---------+---------------+---------+-----------+---------------+--------------+ FV Mid   Partial                                           Acute          +---------+---------------+---------+-----------+---------------+--------------+ FV DistalNone                    No                        Acute          +---------+---------------+---------+-----------+---------------+--------------+ PFV      None                                              Acute          +---------+---------------+---------+-----------+---------------+--------------+ POP      None                                              Acute          +---------+---------------+---------+-----------+---------------+--------------+ PTV  No                        not well                                                                  visualized                                                                with                                                                      compression                                                               due to severe                                                             pitting edema  +---------+---------------+---------+-----------+---------------+--------------+ PERO                             No                        not well                                                                  visualized                                                                with  compression                                                               due to severe                                                             pitting edema  +---------+---------------+---------+-----------+---------------+--------------+ GSV      None                    No                                       +---------+---------------+---------+-----------+---------------+--------------+ Multiple prominent Hypochoic lesions seen at groin area with non-vascularity. Etiology unknown.  Right Technical Findings: Patient right external iliac vein area could not fully exam due to patient's discomfort and pain, she refused the probe to touch.  Left Venous Findings: +---------+---------------+---------+-----------+----------+-------------------+          CompressibilityPhasicitySpontaneityPropertiesSummary             +---------+---------------+---------+-----------+----------+-------------------+ CFV      None                    No                   Acute                +---------+---------------+---------+-----------+----------+-------------------+ SFJ      None                                         Acute               +---------+---------------+---------+-----------+----------+-------------------+ FV Prox  None                    No                   Acute               +---------+---------------+---------+-----------+----------+-------------------+ FV Mid   None                    No                   Acute               +---------+---------------+---------+-----------+----------+-------------------+ FV Distal                        No                   poor visualization  with no flow                                                              detected.           +---------+---------------+---------+-----------+----------+-------------------+ PFV                                                   Not visualized      +---------+---------------+---------+-----------+----------+-------------------+ POP      None                    No                   Acute               +---------+---------------+---------+-----------+----------+-------------------+ PTV                                                   not well visualized                                                       with compression                                                          due to severe                                                             pitting edema       +---------+---------------+---------+-----------+----------+-------------------+ PERO                                                  not well visualized                                                       with compression  due to severe                                                             pitting edema        +---------+---------------+---------+-----------+----------+-------------------+ Multiple prominent Hypochoic lesions seen at groin area with non-vascularity. Etiology unknown.  Left Technical Findings: Left external iliac vein thrombosis appears.   Summary: Right: Findings consistent with acute deep vein thrombosis involving the right common femoral vein, right femoral vein, right proximal profunda vein, and right popliteal vein. Findings consistent with acute superficial vein thrombosis involving the right  great saphenous vein. No cystic structure found in the popliteal fossa. Left: Findings consistent with acute deep vein thrombosis involving the left common femoral vein, left femoral vein, and left popliteal vein. No cystic structure found in the popliteal fossa.  *See table(s) above for measurements and observations. Electronically signed by Curt Jews MD on 10/18/2018 at 2:03:24 PM.    Final     Microbiology Recent Results (from the past 240 hour(s))  Urine culture     Status: None   Collection Time: 10/18/2018  3:22 PM  Result Value Ref Range Status   Specimen Description   Final    URINE, RANDOM Performed at [facility], [place] [address], [place], [place] [address]    Special Requests   Final    NONE Performed at [facility], [place] [address] [place], [place] [address]    Culture   Final    NO GROWTH Performed at [facility], [place] [address], [place], [place] [address]    Report Status 10/19/2018 FINAL  Final  Culture, blood (x 2)     Status: None   Collection Time: 10/18/18  8:20 AM  Result Value Ref Range Status   Specimen Description   Final    BLOOD LEFT ARM Performed at [facility] [address], [place], [place] [address]    Special Requests   Final    BOTTLES DRAWN AEROBIC AND ANAEROBIC Blood Culture adequate volume   Culture   Final    NO GROWTH 5 DAYS Performed at [facility], [place] [address]  [place], [place] [address]    Report Status 10/23/2018 FINAL  Final  Culture, blood (x 2)     Status: None   Collection Time: 10/18/18 12:02 PM  Result Value Ref Range Status   Specimen Description   Final    BLOOD LEFT HAND Performed at [facility] [address], [place], [place] [address]    Special Requests   Final    BOTTLES DRAWN AEROBIC ONLY Blood Culture results may not be optimal due to an inadequate volume of blood received in culture bottles Performed at [facility] [address] [place], [place] [address]    Culture   Final    NO GROWTH 5 DAYS Performed at [facility], [place] [address] [place],  [address]    Report Status 10/23/2018 FINAL  Final    Lab Basic Metabolic Panel: Recent Labs  Lab 10/18/18 1613  10/20/18 0330 10/21/18 0423 10/22/18 1018 10/23/18 0435 2018-11-06 0517  NA  --    < > 144 143 146* 150* 148*  K  --    < >  4.8 4.3 4.0 4.3 4.8  CL  --    < > 102 107 111 116* 116*  CO2  --    < > 26 26 24 22  17*  GLUCOSE  --    < > 109* 137* 114* 117* 175*  BUN  --    < > 98* 106* 99* 92* 107*  CREATININE  --    < > 1.74* 1.53* 1.48* 1.39* 1.91*  CALCIUM  --    < > 10.1 9.7 9.4 9.4 9.6  MG 2.4  --   --   --   --   --   --    < > = values in this interval not displayed.   Liver Function Tests: Recent Labs  Lab 10/21/18 0423  AST 21  ALT 13  ALKPHOS 73  BILITOT 0.9  PROT 5.7*  ALBUMIN 2.9*   Recent Labs  Lab 10/18/18 0500  LIPASE 25   CBC: Recent Labs  Lab 10/20/18 1152 10/21/18 0423 10/21/18 1209 10/22/18 0735 10/23/18 0435 Nov 04, 2018 0517  WBC 17.4* 15.8*  --  20.8* 16.0* 18.9*  HGB 13.0 12.2 12.2 13.2 12.4 13.3  HCT 42.1 39.9 39.8 44.3 41.6 45.8  MCV 99.1 101.0*  --  103.7* 104.0* 106.0*  PLT 181 186  --  200 175 221   Cardiac Enzymes: Recent Labs  Lab 10/18/18 0500 10/18/18 0820 10/18/18 1202  TROPONINI 0.05* 0.04* 0.04*   Sepsis Labs: Recent Labs  Lab 11/05/2018 1808  10/18/18 0500  10/21/18 0423 10/22/18 0735 10/23/18 0435 November 04, 2018 0517  PROCALCITON  --  0.36  --   --   --   --   --   WBC  --   --    < > 15.8* 20.8* 16.0* 18.9*  LATICACIDVEN 3.35* 2.7*  --   --   --   --   --    < > = values in this interval not displayed.      Bonnielee Haff 04-Nov-2018, 3:29 PM

## 2018-11-08 NOTE — Progress Notes (Signed)
Palliative:  Ms. Menden is resting but not completely comfortable. I just missed her son who has just left the hospital. Per RN the son is aware of her transition to hospice and asking when this will take place. I fear that he does not truly understand what hospice means and the care she will receive there will be EOL care. I also spoke with hospice liaison, Erling Conte, and updated her on conversation with patient and family and the challenges we have had with son. She plans to call son to discuss transition to [facility].   I followed up to see how conversation with son went regarding transition to hospice but patient has died. I am hoping that son can find some peace and community for support.   15 min  Vinie Sill, NP Palliative Medicine Team Pager # [phone] (M-F 8a-5p) Team Phone # [phone] (Nights/Weekends)

## 2018-11-08 NOTE — Progress Notes (Signed)
OT Cancellation Note  Patient Details Name: Christina [name] MRN: [id number] DOB: 05/31/1931   Cancelled Treatment:    Reason Eval/Treat Not Completed: Other (comment).  Noted pt wants residential hospice. Will sign off; if plan changes, please reconsult [place].    Muhammed Teutsch 2018-11-04, 7:29 AM  Lesle Chris, OTR/L Acute Rehabilitation Services [phone] [facility] pager [phone] office 11/04/2018

## 2018-11-08 NOTE — Progress Notes (Addendum)
Called to room by NT at 1524, pt found without pulse or breathing, confirmed with second RN, Norberta Keens, MD and charge RN notified, IV and foley catheter removed

## 2018-11-08 NOTE — Progress Notes (Signed)
Pharmacy Antibiotic Note  Christina [name] is a 85 y.o. female admitted on 11/04/2018 with abdominal pain, chest pain, generalized weakness. Pharmacy consulted today for Unasyn dosing for aspiration pneumonia.  Pt received 6 days of ceftriaxone. Antibiotics changed to ampicillin/sulbactam on 12/16.  Today, 11-16-2018  WBC 18.9 remains elevated  SCr 1.91, CrCl ~23 mL/min. SCr increased  Afebrile  Plan:  Given increase in SCr, adjust antibiotic dose to Ampicillin/sulbactam 3 g IV q12   Closely monitor renal function and adjust as needed  Follow culture data, clinical course  Height: 5\' 5"  (165.1 cm) Weight: 180 lb 1.6 oz (81.7 kg) IBW/kg (Calculated) : 57  Temp (24hrs), Avg:98 F (36.7 C), Min:97.4 F (36.3 C), Max:99.2 F (37.3 C)  Recent Labs  Lab 11/05/2018 1514 10/15/2018 1808 10/18/18 0500  10/20/18 0330 10/20/18 1152 10/21/18 0423 10/22/18 0735 10/22/18 1018 10/23/18 0435 2018/11/16 0517  WBC  --   --   --    < > 16.7* 17.4* 15.8* 20.8*  --  16.0* 18.9*  CREATININE  --   --   --    < > 1.74*  --  1.53*  --  1.48* 1.39* 1.91*  LATICACIDVEN 3.86* 3.35* 2.7*  --   --   --   --   --   --   --   --    < > = values in this interval not displayed.    Estimated Creatinine Clearance: 22.7 mL/min (A) (by C-G formula based on SCr of 1.91 mg/dL (H)).    No Known Allergies  Antimicrobials this admission: 12/10 Ceftriaxone >> 12/16 12/16 ampicillin/sulbactam >>   Dose adjustments this admission: 12/17 ampicillin/sulbactam 3 g IV q8h --> 3 g IV q12h  Microbiology results: 12/11 BCx: NGF 12/10 UCx: NGF   Thank you for allowing pharmacy to be a part of this patient's care.  Lenis Noon, PharmD Clinical Pharmacist 2018/11/16 1:12 PM

## 2018-11-08 NOTE — Evaluation (Signed)
Clinical/Bedside Swallow Evaluation Patient Details  Name: Christina [name] MRN: [id number] Date of Birth: 08/05/1931  Today's Date: November 05, 2018 Time: SLP Start Time (ACUTE ONLY): 6606 SLP Stop Time (ACUTE ONLY): 0852 SLP Time Calculation (min) (ACUTE ONLY): 13 min  Past Medical History:  Past Medical History:  Diagnosis Date  . Cancer ([place]) 08/2018   ovarian   Past Surgical History:  Past Surgical History:  Procedure Laterality Date  . CHOLECYSTECTOMY     HPI:  85 yo female adm to [facility] with hematemesis and UTI- pt has h/o ovarian cancer with peritoneal carcinomatosis.  Per review of MD notes, pt desires hospice.  Swallow evaluation ordered.    Assessment / Plan / Recommendation Clinical Impression  Interpreter [id number] [place] used for 12 minutes and then Ipad cut off and despite multilpe attempts was not able to be reconnected. Initially phoned for interpreter - phone number on board to request presence but no one answered [phone], therefore left message requesting interpreter presence.  Pt with negative CN exam, she however does present with oral retention of bloody secretions and though SLP provided oral care, she was resistant.    Pt only willing to accept tsp of water - which appeared severely uncomfortable for her to swallow as she was wincing.  Pt also with delayed throat clear, baseline weak cough.  Given pt desires hospice - recommend comfort diet only as she tolerates.     Note food/drink spread throughout the room =- when asked if she consumed items, she denied.  Pt states she is not hungry or thirsty and does not want anything to eat or drink. Educated her to importance of oral care = allowing RNs to clean her mouth for her comfort.      SLP Visit Diagnosis: Dysphagia, oropharyngeal phase (R13.12)    Aspiration Risk  Severe aspiration risk;Risk for inadequate nutrition/hydration    Diet Recommendation Other (Comment)(comfort diet as pt desires)   Medication  Administration: Via alternative means(if able ) Postural Changes: Seated upright at 90 degrees;Remain upright for at least 30 minutes after po intake    Other  Recommendations Oral Care Recommendations: Oral care QID   Follow up Recommendations None      Frequency and Duration min 1 x/week  1 week       Prognosis Prognosis for Safe Diet Advancement: Guarded Barriers to Reach Goals: Other (Comment)(pt lack of desire for po and current dysphagia)      Swallow Study   General Date of Onset: 11-05-18 HPI: 85 yo female adm to [facility] with hematemesis and UTI- pt has h/o ovarian cancer with peritoneal carcinomatosis.  Per review of MD notes, pt desires hospice.  Swallow evaluation ordered.  Type of Study: Bedside Swallow Evaluation Diet Prior to this Study: Thin liquids(clears) Temperature Spikes Noted: No Respiratory Status: Nasal cannula History of Recent Intubation: No Behavior/Cognition: Alert(doses off easily however) Oral Cavity Assessment: Other (comment)(excessive blood tinged secretions throughout - provided oral care but pt is somewhat resistant due to discomfort) Oral Cavity - Dentition: Other (Comment)(did not place dentures ) Self-Feeding Abilities: Total assist Patient Positioning: Upright in bed Baseline Vocal Quality: Low vocal intensity Volitional Cough: Cognitively unable to elicit Volitional Swallow: Unable to elicit    Oral/Motor/Sensory Function Overall Oral Motor/Sensory Function: Generalized oral weakness   Ice Chips Ice chips: Not tested Other Comments: pt coughing on secretions   Thin Liquid Thin Liquid: Impaired Presentation: Spoon Pharyngeal  Phase Impairments: Other (comments);Throat Clearing - Immediate(wincing with swallowing and indicating discomfort with swallowing)  Nectar Thick Nectar Thick Liquid: Not tested   Honey Thick Honey Thick Liquid: Not tested   Puree Puree: Not tested Other Comments: pt declined to consume   Solid     Solid: Not  tested Other Comments: pt declined to consume      Macario Golds 06-Nov-2018,9:23 AM  Luanna Salk, [facility] SLP Acute Rehab Services Pager [phone] Office [phone]

## 2018-11-08 NOTE — Progress Notes (Signed)
CSW following for residential hospice placement.  CSW discussed placement options with the physician, the patient is agreeable to Hospice placement.  CSW met with the patient son and Friend at beside to discuss disposition plan. Patient son is now agreeable to pursue residential hospice at [facility]. He understands he cannot care for the patient at home. CSW made referral to [facility] and provided the son contact information. Patient son is requiring a wheelchair, he inquired if [facility] placement will have a accessible wheelchair.  BP will follow up with the patient son.   Christina [name], Marlinda Mike, MSW Clinical Social Worker  [phone] 11/21/18  1:22 PM

## 2018-11-08 NOTE — Progress Notes (Signed)
PT Cancellation Note  Patient Details Name: Christina [name] MRN: [id number] DOB: [birth year]   Cancelled Treatment:    Reason Eval/Treat Not Completed: Pt has decided to go to residential hospice. Will sign off.    Weston Anna, PT Acute Rehabilitation Services Pager: [phone] Office: [phone]

## 2018-11-08 NOTE — Progress Notes (Signed)
[facility] PROGRESS NOTE  Christina [name] QMG:[id number] DOB: 27-Dec-1930 DOA: 10/22/2018  PCP: Nolene Ebbs, MD  Brief History/Interval Summary: 85 y.o. female with medical history significant of ovarian cancer, depression, leg edema, who presented with abdominal pain, chest pain.  Patient speaks limited [place].  She is originally from [place]. Pt has bilateral leg edema, she was seen in ED on 08/31/2018, and started with Lasix. Per report, pt recently refused hospice care. CT head showed possible focal infection, but MRI is negative for acute stroke and it showed old infarction.  CT abdomen/pelvis that showed peritoneal carcinomatosis.  Patient is admitted to telemetry bed as inpatient.  Despite antibiotics patient did not improve.  She continued to get worse.  She was found to have lower extremity DVT.  She was started on anticoagulation however she had bleeding complications.  Palliative medicine was consulted.   Reason for Visit: Abdominal pain due to peritoneal carcinomatosis.  Urinary tract infection.  Acute bilateral lower extremity DVT  Consultants: Palliative medicine.  Medical oncology  Procedures:   Lower extremity venous Doppler Positive Findings consistent with Acute Deep Vein Thrombosis involving the right common femoral vein, right femoral vein, right proximal profunda vein, and right popliteal vein, the left common femoral vein, left femoral vein, and left popliteal vein. Findings consistent with acute superficial vein thrombosis involving the right great saphenous vein.  Exam also attempted to approach bilateral external iliac veins. Left external iliac vein appears thrombosis. Right side unable to approach due to patient's refusal.  Incidental findings: Multiple prominent Hypochoic lesions seen at groin area bilaterally with non-vascularity. Etiology unknown  Transthoracic echocardiogram Study Conclusions  - Left ventricle: The cavity size was normal. Systolic  function was   vigorous. The estimated ejection fraction was in the range of 65%   to 70%. Wall motion was normal; there were no regional wall   motion abnormalities. The study isindeterminate for the   evaluation of LV diastolic function. - Pericardium, extracardiac: There was a pleural effusion.  Impressions:  - Significant arrhythmia, including ventricular bigeminy and   nonsustained VT, seen throughout study. Normal LV EF, no   significant valve disease. Technically difficult study due to   patient factors, no subcostal or suprasternal notch windows.  Antibiotics: Ceftriaxone  Subjective/Interval History: Patient noted to be lethargic this morning.  Arousable but fatigued.  Objective:  Vital Signs  Vitals:   10/23/18 1500 10/23/18 1754 10/23/18 2123 11-10-2018 0450  BP: (!) 132/95  (!) 139/55 (!) 100/41  Pulse: (!) 56  (!) 59 (!) 54  Resp: _0 Temp: 97.8 F (36.6 C)  97.6 F (36.4 C) 99.2 F (37.3 C)  TempSrc: Axillary  Oral   SpO2: 91% (!) 89% 96% 94%  Weight:      Height:        Intake/Output Summary (Last 24 hours) at 10-Nov-2018 1223 Last data filed at 11-10-2018 0600 Gross per 24 hour  Intake 1346.16 ml  Output 907 ml  Net 439.16 ml   Filed Weights   10/18/18 1714  Weight: 81.7 kg    General appearance: Lethargic. Resp: Tachypneic.  Coarse breath sounds bilaterally.  Crackles in the right base. Cardio: S1-S2 is normal regular.  No S3-S4.  No rubs murmurs or bruit GI: Abdomen soft.  Nondistended.  Tender in the lower abdomen without any rebound rigidity or guarding Extremities: Bilateral lower extremity edema Neurologic: Slightly agitated today.  No obvious neurological deficits.  Lab Results:  Data Reviewed: I have personally  reviewed following labs and imaging studies  CBC: Recent Labs  Lab 10/20/18 1152 10/21/18 0423 10/21/18 1209 10/22/18 0735 10/23/18 0435 Nov 16, 2018 0517  WBC 17.4* 15.8*  --  20.8* 16.0* 18.9*  HGB 13.0 12.2  12.2 13.2 12.4 13.3  HCT 42.1 39.9 39.8 44.3 41.6 45.8  MCV 99.1 101.0*  --  103.7* 104.0* 106.0*  PLT 181 186  --  200 175 355    Basic Metabolic Panel: Recent Labs  Lab 10/18/18 1613  10/20/18 0330 10/21/18 0423 10/22/18 1018 10/23/18 0435 November 16, 2018 0517  NA  --    < > 144 143 146* 150* 148*  K  --    < > 4.8 4.3 4.0 4.3 4.8  CL  --    < > 102 107 111 116* 116*  CO2  --    < > _0 17*  GLUCOSE  --    < > 109* 137* 114* 117* 175*  BUN  --    < > 98* 106* 99* 92* 107*  CREATININE  --    < > 1.74* 1.53* 1.48* 1.39* 1.91*  CALCIUM  --    < > 10.1 9.7 9.4 9.4 9.6  MG 2.4  --   --   --   --   --   --    < > = values in this interval not displayed.    GFR: Estimated Creatinine Clearance: 22.7 mL/min (A) (by C-G formula based on SCr of 1.91 mg/dL (H)).  Liver Function Tests: Recent Labs  Lab 10/31/2018 1509 10/21/18 0423  AST 35 21  ALT 18 13  ALKPHOS 100 73  BILITOT 1.7* 0.9  PROT 6.9 5.7*  ALBUMIN 2.8* 2.9*    Recent Labs  Lab 10/18/18 0500  LIPASE 25   Coagulation Profile: Recent Labs  Lab 10/18/18 0500  INR 1.43    Cardiac Enzymes: Recent Labs  Lab 10/19/2018 1509 10/18/18 0500 10/18/18 0820 10/18/18 1202  TROPONINI 0.05* 0.05* 0.04* 0.04*     Recent Results (from the past 240 hour(s))  Urine culture     Status: None   Collection Time: 10/12/2018  3:22 PM  Result Value Ref Range Status   Specimen Description   Final    URINE, RANDOM Performed at [facility] [address] [place], [place] [address]    Special Requests   Final    NONE Performed at [facility], [place] [address], [place], [place] [address]    Culture   Final    NO GROWTH Performed at [facility], [place] [address], [place], [place] [address]    Report Status 10/19/2018 FINAL  Final  Culture, blood (x 2)     Status: None   Collection Time: 10/18/18  8:20 AM  Result Value Ref Range Status   Specimen Description   Final     BLOOD LEFT ARM Performed at [facility] [address], [place], [place] [address]    Special Requests   Final    BOTTLES DRAWN AEROBIC AND ANAEROBIC Blood Culture adequate volume   Culture   Final    NO GROWTH 5 DAYS Performed at [facility], [place] [address] [place], [place] [address]    Report Status 10/23/2018 FINAL  Final  Culture, blood (x 2)     Status: None   Collection Time: 10/18/18 12:02 PM  Result Value Ref Range Status   Specimen Description   Final  BLOOD LEFT HAND Performed at [facility] [address], [place], [place] [address]    Special Requests   Final    BOTTLES DRAWN AEROBIC ONLY Blood Culture results may not be optimal due to an inadequate volume of blood received in culture bottles Performed at [facility] [address] [place], [place] [address]    Culture   Final    NO GROWTH 5 DAYS Performed at [facility], [place] [address], [place], [place] [address]    Report Status 10/23/2018 FINAL  Final      Radiology Studies: Dg Chest Port 1 View  Result Date: 10/23/2018 CLINICAL DATA:  Shortness of breath and hypoxia EXAM: PORTABLE CHEST 1 VIEW COMPARISON:  10/20/2018 FINDINGS: Shallow lung inflation with markedly worsened aeration of the right lung. Small right pleural effusion. Mild cardiomegaly is unchanged. IMPRESSION: Shallow lung inflation with markedly worsened aeration of the right lung, which could indicate asymmetric pulmonary edema or developing pneumonia. Small right pleural effusion. Electronically Signed   By: Ulyses Jarred M.D.   On: 10/23/2018 16:33     Medications:  Scheduled: . dexamethasone  4 mg Intravenous Q12H  . glycopyrrolate  0.4 mg Intravenous QID  . letrozole  2.5 mg Oral Daily  . lidocaine  1 patch Transdermal Q24H  . metoCLOPramide (REGLAN) injection  5 mg Intravenous Q8H  . oxymetazoline  3 spray Each Nare Q6H  . pantoprazole (PROTONIX) IV  40 mg  Intravenous Q24H  . sodium chloride  2 spray Each Nare TID   Continuous: . ampicillin-sulbactam (UNASYN) IV 3 g (11-11-18 1010)  . dextrose 50 mL/hr at 11-11-18 0750   AES:[id number], morphine injection, nitroGLYCERIN, ondansetron (ZOFRAN) IV, phenol, promethazine    Assessment/Plan:  Goals of care Despite multiple attempts by self, palliative medicine patient's son was not amenable to have a conversation regarding goals of care.  He continued to mention that patient will get better.  Requested oncology to to come and see the patient.  Appreciate Dr. Geralyn Flash assistance.  He also noted that the patient's son was impeding conversation with patient.  He was able to converse with patient using interpreter and her wishes were to be made comfortable.  She wanted to go to hospice.  Discussed with social worker today.  Referral to residential hospice.  CODE STATUS changed to DNR.  Epistaxis/coffee-ground emesis Continues to have some coffee-ground emesis and epistaxis.  Anticoagulation was discontinued many days ago.  Continue with Afrin nasal spray.  No she is hospice candidate.  Treat pain.  Keep her comfortable.  Okay to continue Reglan for now to try and see if it improves her GI motility.  Possible aspiration pneumonia Chest x-ray suggested aspiration.  Patient's antibiotics changed over to Unasyn.  Now she is hospice candidate.  Urinary tract infection with sepsis Patient met criteria for sepsis at admission with leukocytosis tachypnea and elevated lactic acid level.  UA was noted to be abnormal.  Patient was started on ceftriaxone.  Urine culture without any growth.  Blood cultures without any growth so far.  Lactic acid level was initially elevated and improved to 2.7.  Procalcitonin 0.36.  She was on ceftriaxone for a few days.  Acute renal failure on chronic kidney disease stage III Baseline creatinine around 1.6.  Creatinine elevated at 2.4 at admission.  Patient was given IV fluids.   Lasix on hold.  Creatinine has improved to baseline.  BUN is improving slowly.  Continue gentle IV hydration.  No hydronephrosis was noted on CT scan.  Monitor urine output.    Acute bilateral lower extremity DVT Patient noted to have significant edema of lower extremities.  Doppler study positive for DVT.  Most likely due to her cancer.  Patient was initially started on IV heparin.  However significant drop in her platelet counts noted on the morning of 12/12.  Discussed with Dr. Lindi Adie who recommended changing to Xarelto or apixaban.  Patient started on apixaban.  However she developed epistaxis and so we had to hold the apixaban.  Her platelet counts have improved and are now normal.  Due to ongoing bleeding issues we have to hold anticoagulation.  Now she is hospice candidate.    Thrombocytopenia Now resolved.  Significant drop in platelet counts was noted on 12/12.  Heparin was discontinued.  Platelet count is now normal. Etiology likely multifactorial including cancer, consumption, possible heparin-induced thrombocytopenia.    History of ovarian cancer with peritoneal carcinomatosis and retroperitoneal lymphadenopathy Patient is on letrozole which is being continued.  Patient was seen by Dr. Lindi Adie with oncology last month.  He had recommended hospice as the patient is not a candidate for any aggressive interventions and he feels that the cancer will just continue to get worse.  Discussed with Dr. Lindi Adie on 12/13 and he recommended the same.  He recommended a palliative medicine consult as patient's son appears to be resisting any talks of hospice.  Please see oncology note from 12/16.  Patient to be referred for residential hospice placement.  Elevated troponin and chest pain Thought to be secondary to demand ischemia from sepsis.  Troponin levels have been flat.  Patient was started on aspirin and statin.  LDL 136.  Cardiology was consulted.  In view of her other comorbidities she is not a  candidate for further cardiac testing.  HbA1c 5.1.  EKG showed ventricular bigeminy.  Aspirin was discontinued.  Echocardiogram shows normal systolic function without any wall motion abnormalities.  PVCs noted.  Telemetry continues to show bigeminy and PVCs.  Stable for the most part.  Acute metabolic encephalopathy Most likely due to all of the above.  CT scan did raise concern for stroke.  MRI however did not show any acute stroke.  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  Abdominal pain Most likely due to ovarian cancer and peritoneal carcinomatosis.  Abdominal film done yesterday did not show anything concerning.  She could have a low level obstruction versus ileus which could be causing her to have nausea and vomiting.  Low-dose Reglan to be tried for now.  Pain control.  Hypercalcemia Calcium was mildly elevated at admission at 10.9.this was most likely due to dehydration.  Improved subsequently.    Hypernatremia Most likely due to free water deficit.  Change IV fluids to D5 water.  DVT Prophylaxis: Apixaban placed on hold due to bleeding issues as discussed above. Code Status: Now she is DNR Family Communication: No family at bedside Disposition Plan: Referral for residential hospice placement.  Life expectancy is likely to be a few days to 2 weeks at most.    LOS: 6 days   [facility] Pager [phone] Nov 02, 2018, 12:23 PM  If 7PM-7AM, please contact night-coverage at [email or web address], password [facility]

## 2018-11-08 DEATH — deceased

## 2020-07-09 IMAGING — CT CT ABD-PELV W/O CM
2 of 4 series · 16 of 46 positions shown, 18 images · non-contrast
Comparison: None.

CLINICAL DATA: 87 y/o F; increasing weakness, nausea, vomiting,
abdominal pain, pitting edema, leukocytosis. History of ovarian
cancer post chemotherapy.

EXAM:
CT ABDOMEN AND PELVIS WITHOUT CONTRAST
TECHNIQUE: Multidetector CT imaging of the abdomen and pelvis was performed
following the standard protocol without IV contrast.

[Series 2: axial st · axial · 0.75mm/px · z∈[-102,+288]mm · 13 of 86 slices shown, 15 images]
[im 4/86  soft-tissue]
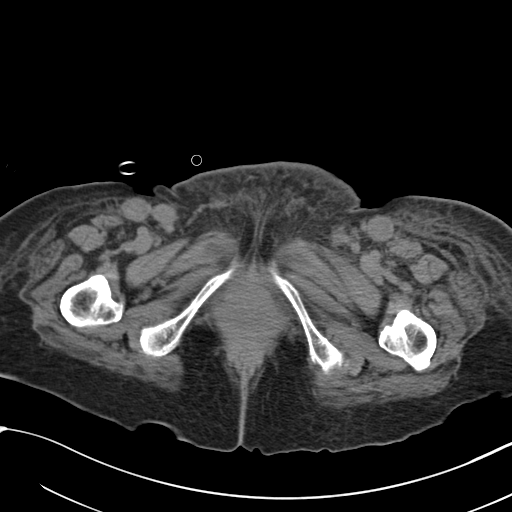
[im 4/86  bone]
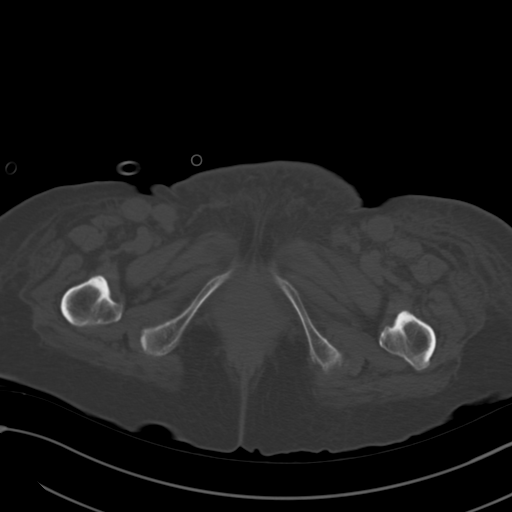
[im 12/86  soft-tissue]
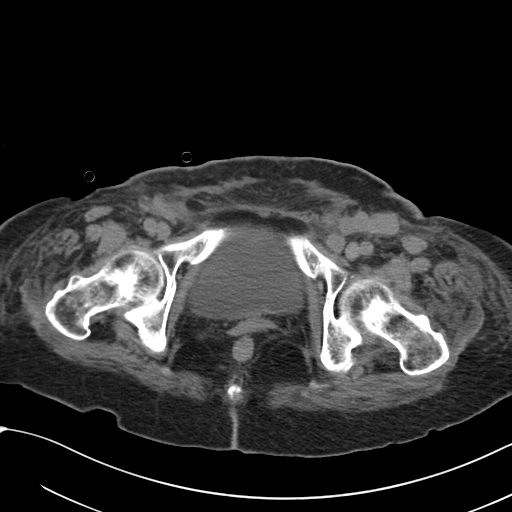
[im 20/86  soft-tissue]
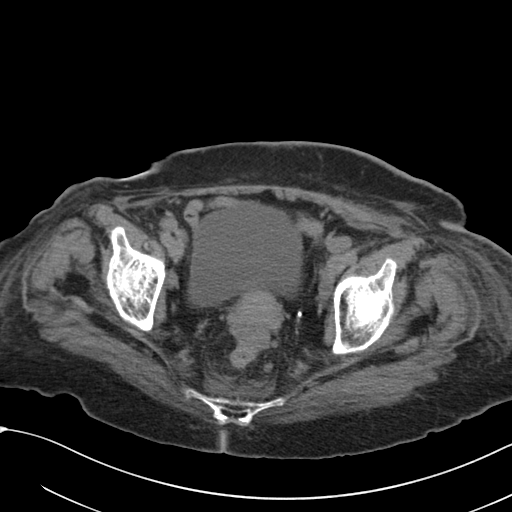
[im 24/86  soft-tissue]
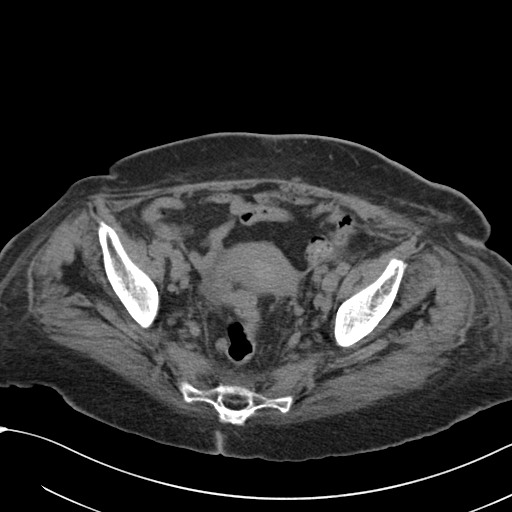
[im 31/86  soft-tissue]
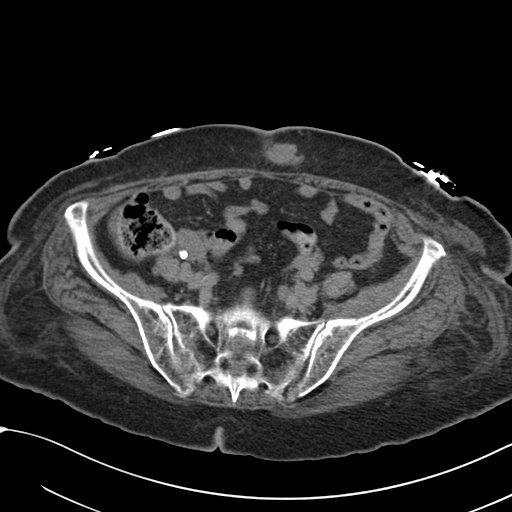
[im 35/86  soft-tissue]
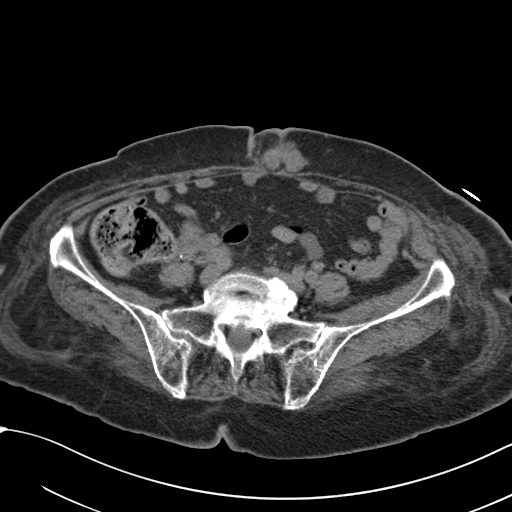
[im 43/86  soft-tissue]
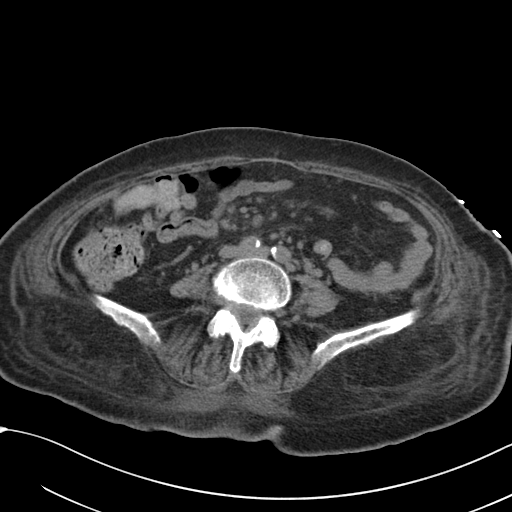
[im 51/86  soft-tissue]
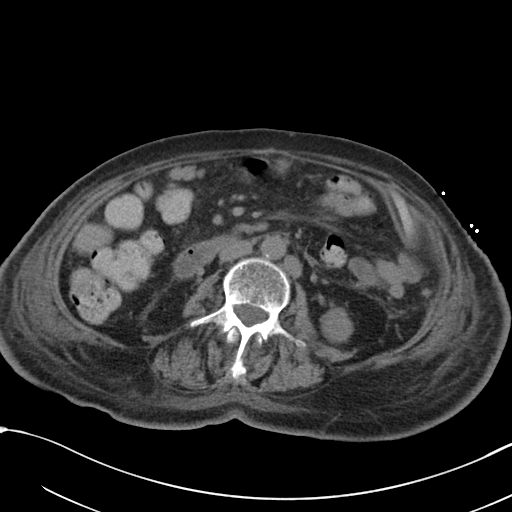
[im 55/86  soft-tissue]
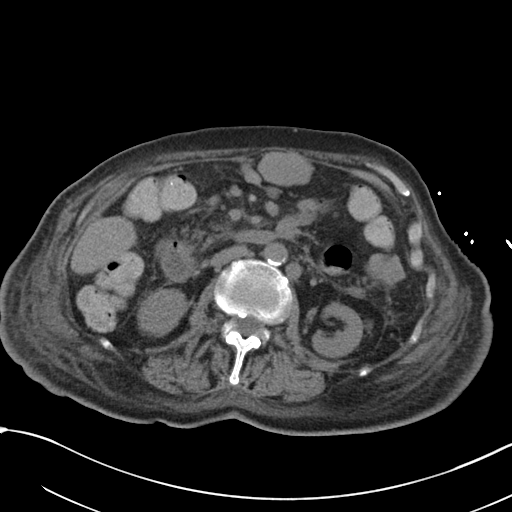
[im 55/86  bone]
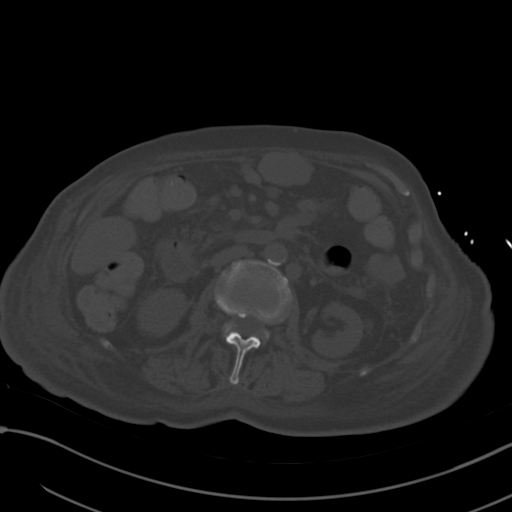
[im 62/86  soft-tissue]
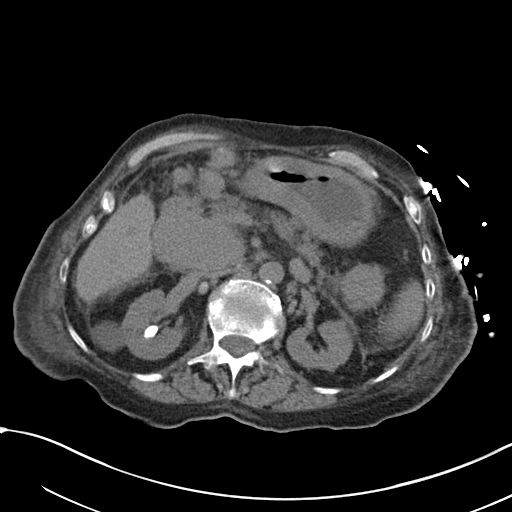
[im 66/86  soft-tissue]
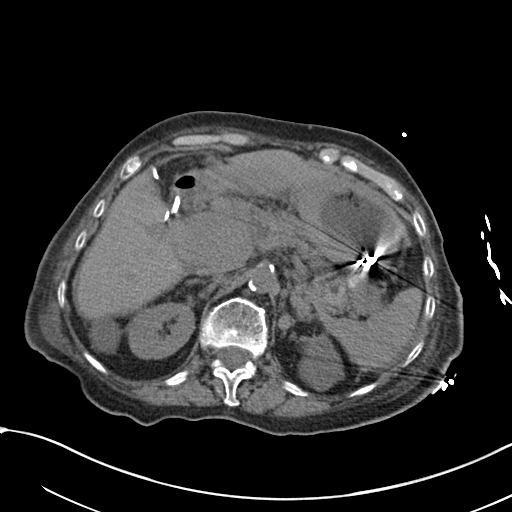
[im 74/86  soft-tissue]
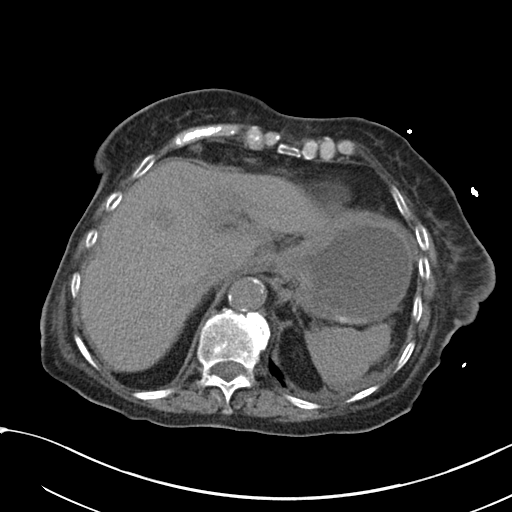
[im 82/86  soft-tissue]
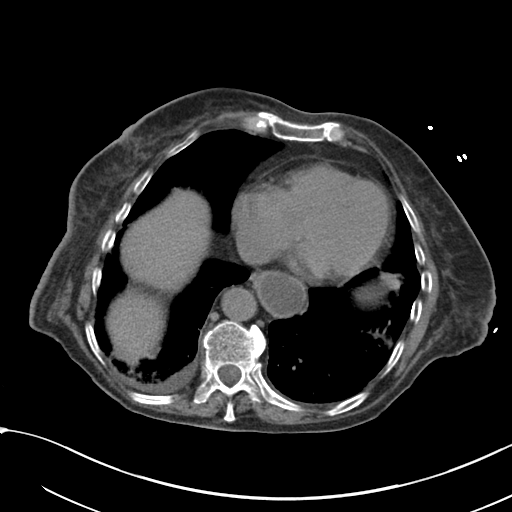

[Series 4: coronal st · coronal · 0.71mm/px · 3 of 85 slices shown]
[im 29/85  soft-tissue]
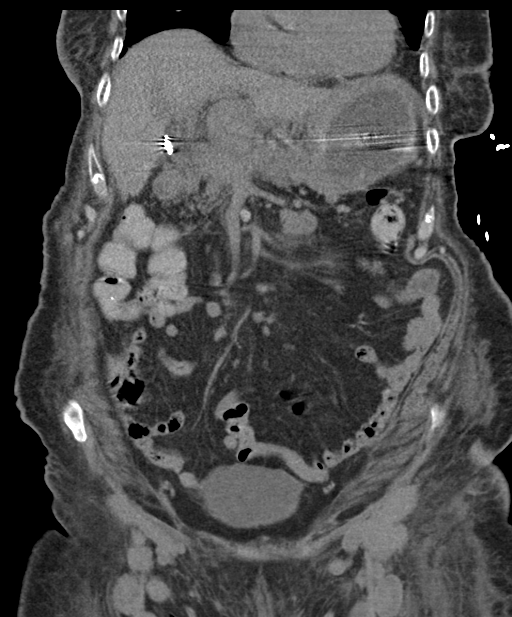
[im 38/85  soft-tissue]
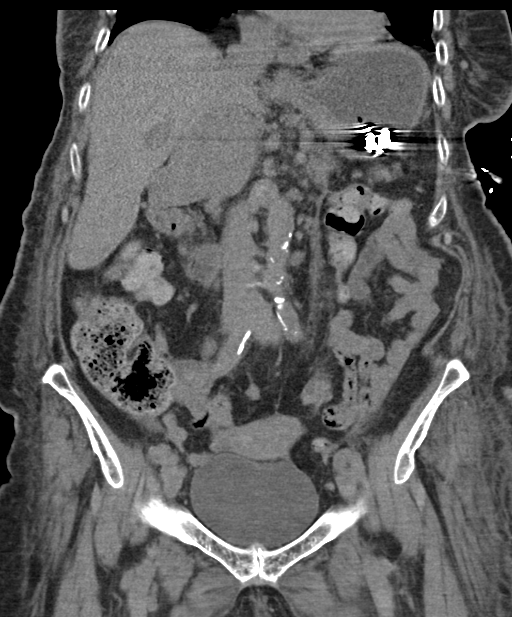
[im 47/85  soft-tissue]
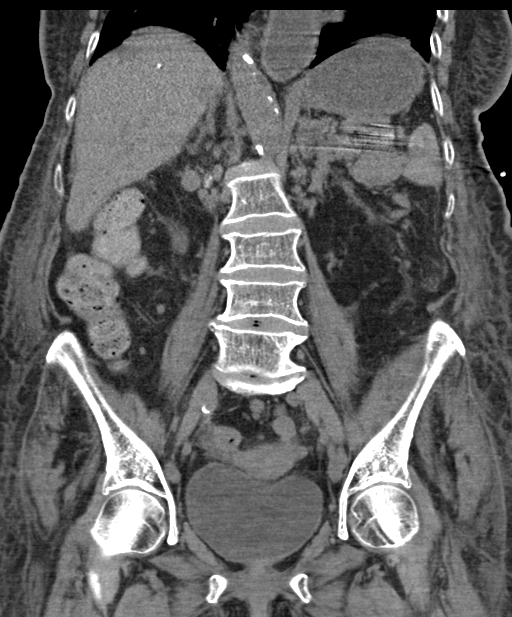

[16 of 46 positions shown; findings below may reference images not displayed]

FINDINGS: Lower chest: Small right pleural effusion. Patulous fluid-filled
esophagus.

Hepatobiliary: Right lobe of liver small calcified granuloma. No
additional focal liver lesion identified. Cholecystectomy.

Pancreas: Unremarkable. No pancreatic ductal dilatation or
surrounding inflammatory changes.

Spleen: Normal in size without focal abnormality.

Adrenals/Urinary Tract: Adrenal glands are unremarkable. Multiple
renal cysts. Right kidney interpolar 12 mm stone. No hydronephrosis.
Normal bladder.

Stomach/Bowel: Stomach is within normal limits. Appendix not
identified, no pericecal inflammation. No evidence of bowel wall
thickening, distention, or inflammatory changes.

Vascular/Lymphatic: Retroperitoneal and mesenteric lymphadenopathy.
Multiple peritoneal masses within the omentum, mesentery, and lesser
sac with the largest lesions as follows: Liver hilum 4.2 x 6.7 cm
(series 2, image 23), splenic hilum 4.1 x 4.2 cm (series 2, image
24), right upper quadrant 2.7 x 2.1 cm (series 2, image 26),
anterior omentum 2.4 x 4.1 cm (series 2, image 32), right lower
quadrant 2.2 x 2.6 cm (series 2, image 54), pouch of Jovel 2.5 x
2.6 cm (series 2, image 66). Additionally, there is a mass implanted
within the paraumbilical hernia measuring up to 3.2 cm. Small volume
ascites. Abdominal aortic calcific atherosclerosis.

Reproductive: Uterus and bilateral adnexa are unremarkable.

Other: No abdominal wall hernia or abnormality. No abdominopelvic
ascites.

Musculoskeletal: No fracture is seen.
IMPRESSION: 1. Multiple peritoneal masses, likely peritoneal carcinomatosis,
with the largest mass measuring 6.7 cm at the liver hilum.
2. Retroperitoneal and mesenteric lymphadenopathy, probably
metastatic.
3. Small right pleural effusion.  Small volume ascites.
4. Patulous fluid-filled esophagus.
5. Right kidney interpolar nonobstructing stone.
6. Aortic Atherosclerosis ([id number]).
